# Patient Record
Sex: Male | Born: 1995 | Hispanic: Yes | Marital: Single | Smoking: Current every day smoker
Health system: Southern US, Community
[De-identification: ages and names within clinical notes are randomized; demographics above are authoritative.]

## PROBLEM LIST (undated history)

## (undated) HISTORY — PX: NO PAST SURGERIES: SHX2092

---

## 2020-12-17 ENCOUNTER — Inpatient Hospital Stay (HOSPITAL_COMMUNITY): Payer: No Typology Code available for payment source | Admitting: Anesthesiology

## 2020-12-17 ENCOUNTER — Encounter (HOSPITAL_COMMUNITY): Admission: EM | Disposition: A | Discharge status: Rehabilitation Facility | Payer: Self-pay | Source: Home / Self Care

## 2020-12-17 ENCOUNTER — Emergency Department (HOSPITAL_COMMUNITY): Payer: No Typology Code available for payment source

## 2020-12-17 ENCOUNTER — Inpatient Hospital Stay (HOSPITAL_COMMUNITY): Payer: No Typology Code available for payment source

## 2020-12-17 ENCOUNTER — Inpatient Hospital Stay (HOSPITAL_COMMUNITY)
Admission: EM | Admit: 2020-12-17 | Discharge: 2020-12-25 | DRG: 956 | Disposition: A | Discharge status: Rehabilitation Facility | Payer: No Typology Code available for payment source | Attending: General Surgery | Admitting: General Surgery

## 2020-12-17 ENCOUNTER — Encounter (HOSPITAL_COMMUNITY): Payer: Self-pay

## 2020-12-17 DIAGNOSIS — S72352A Displaced comminuted fracture of shaft of left femur, initial encounter for closed fracture: Secondary | ICD-10-CM | POA: Insufficient documentation

## 2020-12-17 DIAGNOSIS — S0512XA Contusion of eyeball and orbital tissues, left eye, initial encounter: Secondary | ICD-10-CM | POA: Diagnosis present

## 2020-12-17 DIAGNOSIS — Z23 Encounter for immunization: Secondary | ICD-10-CM

## 2020-12-17 DIAGNOSIS — S2222XA Fracture of body of sternum, initial encounter for closed fracture: Secondary | ICD-10-CM | POA: Diagnosis present

## 2020-12-17 DIAGNOSIS — Y907 Blood alcohol level of 200-239 mg/100 ml: Secondary | ICD-10-CM | POA: Diagnosis present

## 2020-12-17 DIAGNOSIS — S72322A Displaced transverse fracture of shaft of left femur, initial encounter for closed fracture: Principal | ICD-10-CM

## 2020-12-17 DIAGNOSIS — S12100A Unspecified displaced fracture of second cervical vertebra, initial encounter for closed fracture: Secondary | ICD-10-CM | POA: Diagnosis present

## 2020-12-17 DIAGNOSIS — H05233 Hemorrhage of bilateral orbit: Secondary | ICD-10-CM

## 2020-12-17 DIAGNOSIS — S2232XA Fracture of one rib, left side, initial encounter for closed fracture: Secondary | ICD-10-CM | POA: Diagnosis present

## 2020-12-17 DIAGNOSIS — Z20822 Contact with and (suspected) exposure to covid-19: Secondary | ICD-10-CM | POA: Diagnosis present

## 2020-12-17 DIAGNOSIS — S0511XA Contusion of eyeball and orbital tissues, right eye, initial encounter: Secondary | ICD-10-CM | POA: Diagnosis present

## 2020-12-17 DIAGNOSIS — F17210 Nicotine dependence, cigarettes, uncomplicated: Secondary | ICD-10-CM | POA: Diagnosis present

## 2020-12-17 DIAGNOSIS — Y92410 Unspecified street and highway as the place of occurrence of the external cause: Secondary | ICD-10-CM

## 2020-12-17 DIAGNOSIS — S83282A Other tear of lateral meniscus, current injury, left knee, initial encounter: Secondary | ICD-10-CM | POA: Diagnosis present

## 2020-12-17 DIAGNOSIS — F1020 Alcohol dependence, uncomplicated: Secondary | ICD-10-CM | POA: Diagnosis present

## 2020-12-17 DIAGNOSIS — T1490XA Injury, unspecified, initial encounter: Secondary | ICD-10-CM

## 2020-12-17 DIAGNOSIS — Z419 Encounter for procedure for purposes other than remedying health state, unspecified: Secondary | ICD-10-CM

## 2020-12-17 DIAGNOSIS — S27322A Contusion of lung, bilateral, initial encounter: Secondary | ICD-10-CM | POA: Diagnosis present

## 2020-12-17 DIAGNOSIS — H532 Diplopia: Secondary | ICD-10-CM | POA: Diagnosis present

## 2020-12-17 DIAGNOSIS — S22089A Unspecified fracture of T11-T12 vertebra, initial encounter for closed fracture: Secondary | ICD-10-CM | POA: Diagnosis present

## 2020-12-17 DIAGNOSIS — S22000A Wedge compression fracture of unspecified thoracic vertebra, initial encounter for closed fracture: Secondary | ICD-10-CM

## 2020-12-17 HISTORY — PX: FEMUR IM NAIL: SHX1597

## 2020-12-17 LAB — COMPREHENSIVE METABOLIC PANEL
ALT: 29 U/L (ref 0–44)
AST: 40 U/L (ref 15–41)
Albumin: 3.8 g/dL (ref 3.5–5.0)
Alkaline Phosphatase: 85 U/L (ref 38–126)
Anion gap: 9 (ref 5–15)
BUN: 12 mg/dL (ref 6–20)
CO2: 22 mmol/L (ref 22–32)
Calcium: 8.2 mg/dL — ABNORMAL LOW (ref 8.9–10.3)
Chloride: 106 mmol/L (ref 98–111)
Creatinine, Ser: 1 mg/dL (ref 0.61–1.24)
GFR, Estimated: >60 mL/min (ref >60)
Glucose, Bld: 115 mg/dL — ABNORMAL HIGH (ref 70–99)
Potassium: 3.5 mmol/L (ref 3.5–5.1)
Sodium: 137 mmol/L (ref 135–145)
Total Bilirubin: 0.4 mg/dL (ref 0.3–1.2)
Total Protein: 6.7 g/dL (ref 6.5–8.1)

## 2020-12-17 LAB — CBC
HCT: 43.1 % (ref 39.0–52.0)
HCT: 39 % (ref 39.0–52.0)
Hemoglobin: 14.6 g/dL (ref 13.0–17.0)
Hemoglobin: 12.9 g/dL — ABNORMAL LOW (ref 13.0–17.0)
MCH: 30 pg (ref 26.0–34.0)
MCH: 29.7 pg (ref 26.0–34.0)
MCHC: 33.9 g/dL (ref 30.0–36.0)
MCHC: 33.1 g/dL (ref 30.0–36.0)
MCV: 88.7 fL (ref 80.0–100.0)
MCV: 89.9 fL (ref 80.0–100.0)
Platelets: 308 10*3/uL (ref 150–400)
Platelets: 281 10*3/uL (ref 150–400)
RBC: 4.86 MIL/uL (ref 4.22–5.81)
RBC: 4.34 MIL/uL (ref 4.22–5.81)
RDW: 12.4 % (ref 11.5–15.5)
RDW: 12.7 % (ref 11.5–15.5)
WBC: 17.8 10*3/uL — ABNORMAL HIGH (ref 4.0–10.5)
WBC: 14.3 10*3/uL — ABNORMAL HIGH (ref 4.0–10.5)
nRBC: 0 % (ref 0.0–0.2)
nRBC: 0 % (ref 0.0–0.2)

## 2020-12-17 LAB — PROTIME-INR
INR: 1.1 (ref 0.8–1.2)
Prothrombin Time: 14.4 seconds (ref 11.4–15.2)

## 2020-12-17 LAB — RESP PANEL BY RT-PCR (FLU A&B, COVID) ARPGX2
Influenza A by PCR: NEGATIVE
Influenza B by PCR: NEGATIVE
SARS Coronavirus 2 by RT PCR: NEGATIVE

## 2020-12-17 LAB — SAMPLE TO BLOOD BANK

## 2020-12-17 LAB — HIV ANTIBODY (ROUTINE TESTING W REFLEX): HIV Screen 4th Generation wRfx: NONREACTIVE

## 2020-12-17 LAB — ETHANOL: Alcohol, Ethyl (B): 234 mg/dL — ABNORMAL HIGH (ref <10)

## 2020-12-17 LAB — CDS SEROLOGY

## 2020-12-17 SURGERY — INSERTION, INTRAMEDULLARY ROD, FEMUR
Anesthesia: General | Site: Leg Upper | Laterality: Left

## 2020-12-17 MED ORDER — CLINDAMYCIN PHOSPHATE 900 MG/50ML IV SOLN
INTRAVENOUS | Status: AC
  Filled 2020-12-17: qty 50

## 2020-12-17 MED ORDER — DOCUSATE SODIUM 100 MG PO CAPS
100.0000 mg | ORAL_CAPSULE | Freq: Two times a day (BID) | ORAL | Status: DC
  Administered 2020-12-17 – 2020-12-25 (×16): 100 mg via ORAL
  Filled 2020-12-17 (×15): qty 1

## 2020-12-17 MED ORDER — SODIUM CHLORIDE 0.9 % IV BOLUS
1000.0000 mL | Freq: Once | INTRAVENOUS | Status: AC
  Administered 2020-12-17: 1000 mL via INTRAVENOUS

## 2020-12-17 MED ORDER — ONDANSETRON HCL 4 MG/2ML IJ SOLN
INTRAMUSCULAR | Status: AC
  Filled 2020-12-17: qty 2

## 2020-12-17 MED ORDER — FENTANYL CITRATE (PF) 100 MCG/2ML IJ SOLN
INTRAMUSCULAR | Status: DC | PRN
  Administered 2020-12-17 (×2): 25 ug via INTRAVENOUS
  Administered 2020-12-17 (×2): 50 ug via INTRAVENOUS

## 2020-12-17 MED ORDER — ONDANSETRON HCL 4 MG/2ML IJ SOLN
INTRAMUSCULAR | Status: DC | PRN
  Administered 2020-12-17: 4 mg via INTRAVENOUS

## 2020-12-17 MED ORDER — SODIUM CHLORIDE 0.9 % IR SOLN
Status: DC | PRN
  Administered 2020-12-17: 1000 mL

## 2020-12-17 MED ORDER — SUCCINYLCHOLINE CHLORIDE 200 MG/10ML IV SOSY
PREFILLED_SYRINGE | INTRAVENOUS | Status: AC
  Filled 2020-12-17: qty 10

## 2020-12-17 MED ORDER — ONDANSETRON 4 MG PO TBDP: 4.0000 mg | ORAL_TABLET | Freq: Four times a day (QID) | ORAL | Status: DC | PRN

## 2020-12-17 MED ORDER — PROPOFOL 10 MG/ML IV BOLUS
INTRAVENOUS | Status: AC
  Filled 2020-12-17: qty 20

## 2020-12-17 MED ORDER — PANTOPRAZOLE SODIUM 40 MG PO TBEC
40.0000 mg | DELAYED_RELEASE_TABLET | Freq: Every day | ORAL | Status: DC
  Administered 2020-12-18 – 2020-12-25 (×8): 40 mg via ORAL
  Filled 2020-12-17 (×8): qty 1

## 2020-12-17 MED ORDER — LIDOCAINE 2% (20 MG/ML) 5 ML SYRINGE
INTRAMUSCULAR | Status: DC | PRN
  Administered 2020-12-17: 60 mg via INTRAVENOUS

## 2020-12-17 MED ORDER — ALBUMIN HUMAN 5 % IV SOLN: INTRAVENOUS | Status: DC | PRN

## 2020-12-17 MED ORDER — MORPHINE SULFATE (PF) 2 MG/ML IV SOLN
2.0000 mg | INTRAVENOUS | Status: DC | PRN
  Administered 2020-12-17 (×2): 4 mg via INTRAVENOUS
  Filled 2020-12-17 (×2): qty 2

## 2020-12-17 MED ORDER — ACETAMINOPHEN 500 MG PO TABS
1000.0000 mg | ORAL_TABLET | Freq: Three times a day (TID) | ORAL | Status: DC
  Administered 2020-12-17 – 2020-12-25 (×23): 1000 mg via ORAL
  Filled 2020-12-17 (×22): qty 2

## 2020-12-17 MED ORDER — ONDANSETRON HCL 4 MG PO TABS: 4.0000 mg | ORAL_TABLET | Freq: Four times a day (QID) | ORAL | Status: DC | PRN

## 2020-12-17 MED ORDER — MIDAZOLAM HCL 2 MG/2ML IJ SOLN
INTRAMUSCULAR | Status: AC
  Filled 2020-12-17: qty 2

## 2020-12-17 MED ORDER — DEXAMETHASONE SODIUM PHOSPHATE 10 MG/ML IJ SOLN
INTRAMUSCULAR | Status: AC
  Filled 2020-12-17: qty 1

## 2020-12-17 MED ORDER — LORAZEPAM 1 MG PO TABS: 1.0000 mg | ORAL_TABLET | ORAL | Status: AC | PRN

## 2020-12-17 MED ORDER — HYDROMORPHONE HCL 1 MG/ML IJ SOLN
0.5000 mg | INTRAMUSCULAR | Status: DC | PRN
  Administered 2020-12-21 – 2020-12-23 (×3): 1 mg via INTRAVENOUS
  Filled 2020-12-17 (×3): qty 1

## 2020-12-17 MED ORDER — LIDOCAINE 2% (20 MG/ML) 5 ML SYRINGE
INTRAMUSCULAR | Status: AC
  Filled 2020-12-17: qty 5

## 2020-12-17 MED ORDER — ACETAMINOPHEN 325 MG PO TABS: 325.0000 mg | ORAL_TABLET | Freq: Four times a day (QID) | ORAL | Status: DC | PRN

## 2020-12-17 MED ORDER — METHOCARBAMOL 1000 MG/10ML IJ SOLN
500.0000 mg | Freq: Four times a day (QID) | INTRAVENOUS | Status: DC | PRN
  Filled 2020-12-17: qty 5

## 2020-12-17 MED ORDER — ENOXAPARIN SODIUM 30 MG/0.3ML IJ SOSY: 30.0000 mg | PREFILLED_SYRINGE | Freq: Two times a day (BID) | INTRAMUSCULAR | Status: DC

## 2020-12-17 MED ORDER — MORPHINE SULFATE (PF) 2 MG/ML IV SOLN: 1.0000 mg | INTRAVENOUS | Status: DC | PRN

## 2020-12-17 MED ORDER — FENTANYL CITRATE (PF) 250 MCG/5ML IJ SOLN
INTRAMUSCULAR | Status: AC
  Filled 2020-12-17: qty 5

## 2020-12-17 MED ORDER — THIAMINE HCL 100 MG PO TABS
100.0000 mg | ORAL_TABLET | Freq: Every day | ORAL | Status: DC
  Administered 2020-12-18 – 2020-12-25 (×8): 100 mg via ORAL
  Filled 2020-12-17 (×8): qty 1

## 2020-12-17 MED ORDER — ADULT MULTIVITAMIN W/MINERALS CH
1.0000 | ORAL_TABLET | Freq: Every day | ORAL | Status: DC
  Administered 2020-12-18 – 2020-12-25 (×8): 1 via ORAL
  Filled 2020-12-17 (×8): qty 1

## 2020-12-17 MED ORDER — CLINDAMYCIN PHOSPHATE 600 MG/50ML IV SOLN
600.0000 mg | Freq: Four times a day (QID) | INTRAVENOUS | Status: AC
  Administered 2020-12-18 (×3): 600 mg via INTRAVENOUS
  Filled 2020-12-17 (×3): qty 50

## 2020-12-17 MED ORDER — OXYCODONE HCL 5 MG PO TABS
10.0000 mg | ORAL_TABLET | ORAL | Status: DC | PRN
  Administered 2020-12-19: 15 mg via ORAL
  Administered 2020-12-20: 10 mg via ORAL
  Filled 2020-12-17: qty 2
  Filled 2020-12-17 (×2): qty 3

## 2020-12-17 MED ORDER — ACETAMINOPHEN 325 MG PO TABS: 650.0000 mg | ORAL_TABLET | ORAL | Status: DC | PRN

## 2020-12-17 MED ORDER — ENOXAPARIN SODIUM 30 MG/0.3ML IJ SOSY
30.0000 mg | PREFILLED_SYRINGE | Freq: Two times a day (BID) | INTRAMUSCULAR | Status: DC
  Administered 2020-12-18 – 2020-12-25 (×15): 30 mg via SUBCUTANEOUS
  Filled 2020-12-17 (×15): qty 0.3

## 2020-12-17 MED ORDER — POTASSIUM CHLORIDE IN NACL 20-0.9 MEQ/L-% IV SOLN
INTRAVENOUS | Status: DC
  Filled 2020-12-17 (×2): qty 1000

## 2020-12-17 MED ORDER — METHOCARBAMOL 500 MG PO TABS
500.0000 mg | ORAL_TABLET | Freq: Four times a day (QID) | ORAL | Status: DC | PRN
  Administered 2020-12-19: 500 mg via ORAL
  Filled 2020-12-17: qty 1

## 2020-12-17 MED ORDER — DOCUSATE SODIUM 100 MG PO CAPS
100.0000 mg | ORAL_CAPSULE | Freq: Two times a day (BID) | ORAL | Status: DC
  Administered 2020-12-17: 100 mg via ORAL
  Filled 2020-12-17: qty 1

## 2020-12-17 MED ORDER — SODIUM CHLORIDE 0.9 % IV SOLN: INTRAVENOUS | Status: DC

## 2020-12-17 MED ORDER — ONDANSETRON HCL 4 MG/2ML IJ SOLN: 4.0000 mg | Freq: Four times a day (QID) | INTRAMUSCULAR | Status: DC | PRN

## 2020-12-17 MED ORDER — METOCLOPRAMIDE HCL 10 MG PO TABS: 5.0000 mg | ORAL_TABLET | Freq: Three times a day (TID) | ORAL | Status: DC | PRN

## 2020-12-17 MED ORDER — IOHEXOL 350 MG/ML SOLN
75.0000 mL | Freq: Once | INTRAVENOUS | Status: AC | PRN
  Administered 2020-12-17: 75 mL via INTRAVENOUS

## 2020-12-17 MED ORDER — ROCURONIUM BROMIDE 10 MG/ML (PF) SYRINGE
PREFILLED_SYRINGE | INTRAVENOUS | Status: AC
  Filled 2020-12-17: qty 10

## 2020-12-17 MED ORDER — THIAMINE HCL 100 MG/ML IJ SOLN
100.0000 mg | Freq: Every day | INTRAMUSCULAR | Status: DC
  Administered 2020-12-17: 100 mg via INTRAVENOUS
  Filled 2020-12-17: qty 2

## 2020-12-17 MED ORDER — PANTOPRAZOLE SODIUM 40 MG IV SOLR
40.0000 mg | Freq: Every day | INTRAVENOUS | Status: DC
  Administered 2020-12-17: 40 mg via INTRAVENOUS
  Filled 2020-12-17: qty 40

## 2020-12-17 MED ORDER — MIDAZOLAM HCL 2 MG/2ML IJ SOLN
INTRAMUSCULAR | Status: DC | PRN
  Administered 2020-12-17: 2 mg via INTRAVENOUS

## 2020-12-17 MED ORDER — DIPHENHYDRAMINE HCL 12.5 MG/5ML PO ELIX
12.5000 mg | ORAL_SOLUTION | ORAL | Status: DC | PRN
Start: 2020-12-17 — End: 2020-12-25

## 2020-12-17 MED ORDER — FOLIC ACID 1 MG PO TABS
1.0000 mg | ORAL_TABLET | Freq: Every day | ORAL | Status: DC
  Administered 2020-12-18 – 2020-12-25 (×8): 1 mg via ORAL
  Filled 2020-12-17 (×8): qty 1

## 2020-12-17 MED ORDER — LACTATED RINGERS IV SOLN: INTRAVENOUS | Status: DC

## 2020-12-17 MED ORDER — HYDROMORPHONE HCL 1 MG/ML IJ SOLN
INTRAMUSCULAR | Status: AC
  Filled 2020-12-17: qty 1

## 2020-12-17 MED ORDER — HYDROMORPHONE HCL 1 MG/ML IJ SOLN: 0.2500 mg | INTRAMUSCULAR | Status: DC | PRN

## 2020-12-17 MED ORDER — TETANUS-DIPHTH-ACELL PERTUSSIS 5-2.5-18.5 LF-MCG/0.5 IM SUSY
0.5000 mL | PREFILLED_SYRINGE | Freq: Once | INTRAMUSCULAR | Status: AC
  Administered 2020-12-17: 0.5 mL via INTRAMUSCULAR
  Filled 2020-12-17: qty 0.5

## 2020-12-17 MED ORDER — OXYCODONE HCL 5 MG PO TABS: 10.0000 mg | ORAL_TABLET | ORAL | Status: DC | PRN

## 2020-12-17 MED ORDER — CHLORHEXIDINE GLUCONATE 0.12 % MT SOLN: 15.0000 mL | Freq: Once | OROMUCOSAL | Status: AC

## 2020-12-17 MED ORDER — MORPHINE SULFATE (PF) 4 MG/ML IV SOLN: 4.0000 mg | Freq: Once | INTRAVENOUS | Status: DC

## 2020-12-17 MED ORDER — ORAL CARE MOUTH RINSE: 15.0000 mL | Freq: Once | OROMUCOSAL | Status: AC

## 2020-12-17 MED ORDER — DEXMEDETOMIDINE (PRECEDEX) IN NS 20 MCG/5ML (4 MCG/ML) IV SYRINGE
PREFILLED_SYRINGE | INTRAVENOUS | Status: DC | PRN
  Administered 2020-12-17: 8 ug via INTRAVENOUS
  Administered 2020-12-17: 12 ug via INTRAVENOUS
  Administered 2020-12-17: 4 ug via INTRAVENOUS
  Administered 2020-12-17: 8 ug via INTRAVENOUS

## 2020-12-17 MED ORDER — OXYCODONE HCL 5 MG PO TABS
5.0000 mg | ORAL_TABLET | ORAL | Status: DC | PRN
  Administered 2020-12-17: 10 mg via ORAL
  Administered 2020-12-19: 5 mg via ORAL
  Filled 2020-12-17: qty 1

## 2020-12-17 MED ORDER — ONDANSETRON HCL 4 MG/2ML IJ SOLN
4.0000 mg | Freq: Four times a day (QID) | INTRAMUSCULAR | Status: DC | PRN
Start: 2020-12-17 — End: 2020-12-25

## 2020-12-17 MED ORDER — CLINDAMYCIN PHOSPHATE 900 MG/50ML IV SOLN
INTRAVENOUS | Status: DC | PRN
  Administered 2020-12-17: 900 mg via INTRAVENOUS

## 2020-12-17 MED ORDER — OXYCODONE HCL 5 MG PO TABS: 5.0000 mg | ORAL_TABLET | ORAL | Status: DC | PRN

## 2020-12-17 MED ORDER — ROCURONIUM BROMIDE 10 MG/ML (PF) SYRINGE
PREFILLED_SYRINGE | INTRAVENOUS | Status: DC | PRN
  Administered 2020-12-17: 50 mg via INTRAVENOUS

## 2020-12-17 MED ORDER — SUCCINYLCHOLINE CHLORIDE 20 MG/ML IJ SOLN
INTRAMUSCULAR | Status: DC | PRN
  Administered 2020-12-17: 100 mg via INTRAVENOUS

## 2020-12-17 MED ORDER — OXYCODONE HCL 5 MG PO TABS
5.0000 mg | ORAL_TABLET | ORAL | Status: DC | PRN
  Filled 2020-12-17: qty 2

## 2020-12-17 MED ORDER — DEXAMETHASONE SODIUM PHOSPHATE 10 MG/ML IJ SOLN
INTRAMUSCULAR | Status: DC | PRN
  Administered 2020-12-17: 10 mg via INTRAVENOUS

## 2020-12-17 MED ORDER — PROPOFOL 10 MG/ML IV BOLUS
INTRAVENOUS | Status: DC | PRN
  Administered 2020-12-17: 130 mg via INTRAVENOUS

## 2020-12-17 MED ORDER — CHLORHEXIDINE GLUCONATE 0.12 % MT SOLN
OROMUCOSAL | Status: AC
  Administered 2020-12-17: 15 mL via OROMUCOSAL
  Filled 2020-12-17: qty 15

## 2020-12-17 MED ORDER — SUGAMMADEX SODIUM 200 MG/2ML IV SOLN
INTRAVENOUS | Status: DC | PRN
  Administered 2020-12-17: 300 mg via INTRAVENOUS

## 2020-12-17 MED ORDER — METOCLOPRAMIDE HCL 5 MG/ML IJ SOLN
5.0000 mg | Freq: Three times a day (TID) | INTRAMUSCULAR | Status: DC | PRN
Start: 2020-12-17 — End: 2020-12-20

## 2020-12-17 MED ORDER — LORAZEPAM 2 MG/ML IJ SOLN
1.0000 mg | INTRAMUSCULAR | Status: AC | PRN
Start: 2020-12-17 — End: 2020-12-20

## 2020-12-17 SURGICAL SUPPLY — 49 items
BIT DRILL LONG 4.0 (BIT) ×1 IMPLANT
BIT DRILL SHORT 4.0 (BIT) ×1 IMPLANT
BNDG COHESIVE 6X5 TAN STRL LF (GAUZE/BANDAGES/DRESSINGS) ×6 IMPLANT
COVER PERINEAL POST (MISCELLANEOUS) ×2 IMPLANT
COVER SURGICAL LIGHT HANDLE (MISCELLANEOUS) ×2 IMPLANT
COVER WAND RF STERILE (DRAPES) ×2 IMPLANT
DRAPE C-ARM 42X72 X-RAY (DRAPES) ×2 IMPLANT
DRAPE C-ARMOR (DRAPES) ×4 IMPLANT
DRAPE STERI IOBAN 125X83 (DRAPES) ×2 IMPLANT
DRESSING MEPILEX FLEX 4X4 (GAUZE/BANDAGES/DRESSINGS) ×1 IMPLANT
DRILL BIT LONG 4.0 (BIT) ×2
DRILL BIT SHORT 4.0 (BIT) ×2
DRSG MEPILEX BORDER 4X8 (GAUZE/BANDAGES/DRESSINGS) ×2 IMPLANT
DRSG MEPILEX FLEX 4X4 (GAUZE/BANDAGES/DRESSINGS) ×2
DURAPREP 26ML APPLICATOR (WOUND CARE) ×2 IMPLANT
ELECT REM PT RETURN 9FT ADLT (ELECTROSURGICAL) ×2
ELECTRODE REM PT RTRN 9FT ADLT (ELECTROSURGICAL) ×1 IMPLANT
FACESHIELD WRAPAROUND (MASK) ×2 IMPLANT
GAUZE XEROFORM 5X9 LF (GAUZE/BANDAGES/DRESSINGS) ×2 IMPLANT
GLOVE BIO SURGEON STRL SZ8 (GLOVE) ×2 IMPLANT
GLOVE ORTHO TXT STRL SZ7.5 (GLOVE) ×2 IMPLANT
GLOVE SRG 8 PF TXTR STRL LF DI (GLOVE) ×1 IMPLANT
GLOVE SURG UNDER POLY LF SZ8 (GLOVE) ×2
GOWN STRL REUS W/ TWL LRG LVL3 (GOWN DISPOSABLE) ×1 IMPLANT
GOWN STRL REUS W/ TWL XL LVL3 (GOWN DISPOSABLE) ×3 IMPLANT
GOWN STRL REUS W/TWL LRG LVL3 (GOWN DISPOSABLE) ×2
GOWN STRL REUS W/TWL XL LVL3 (GOWN DISPOSABLE) ×6
GUIDE PIN 3.2X343 (PIN) ×1
GUIDE PIN 3.2X343MM (PIN) ×2
GUIDE ROD 3.0 (MISCELLANEOUS) ×2
KIT BASIN OR (CUSTOM PROCEDURE TRAY) ×2 IMPLANT
KIT TURNOVER KIT B (KITS) ×2 IMPLANT
MANIFOLD NEPTUNE II (INSTRUMENTS) ×2 IMPLANT
NAIL TROCH META-TAN 9X36 LT (Nail) ×2 IMPLANT
NS IRRIG 1000ML POUR BTL (IV SOLUTION) ×2 IMPLANT
PACK GENERAL/GYN (CUSTOM PROCEDURE TRAY) ×2 IMPLANT
PAD ARMBOARD 7.5X6 YLW CONV (MISCELLANEOUS) ×4 IMPLANT
PIN GUIDE 3.2X343MM (PIN) ×1 IMPLANT
ROD GUIDE 3.0 (MISCELLANEOUS) ×1 IMPLANT
SCREW TRIGEN LOW PROF 5.0X40 (Screw) ×2 IMPLANT
SCREW TRIGEN LOW PROF 5.0X70 (Screw) ×2 IMPLANT
SUT VIC AB 0 CT1 27 (SUTURE) ×4
SUT VIC AB 0 CT1 27XBRD ANBCTR (SUTURE) ×2 IMPLANT
SUT VIC AB 1 CT1 27 (SUTURE) ×2
SUT VIC AB 1 CT1 27XBRD ANBCTR (SUTURE) ×1 IMPLANT
SUT VIC AB 2-0 CT1 27 (SUTURE) ×4
SUT VIC AB 2-0 CT1 TAPERPNT 27 (SUTURE) ×2 IMPLANT
TOWEL GREEN STERILE (TOWEL DISPOSABLE) ×2 IMPLANT
TOWEL GREEN STERILE FF (TOWEL DISPOSABLE) ×2 IMPLANT

## 2020-12-17 NOTE — Brief Op Note (Signed)
12/17/2020  7:59 PM  PATIENT:  Bob Hawkins  25 y.o. male  PRE-OPERATIVE DIAGNOSIS:  left femur shaft fracture  POST-OPERATIVE DIAGNOSIS:  left femur shaft fracture  PROCEDURE:  Procedure(s): INTRAMEDULLARY (IM) NAIL FEMORAL (Left)  SURGEON:  Surgeon(s) and Role:    Kathryne Hitch, MD - Primary  PHYSICIAN ASSISTANT:  Rexene Edison, PA-C  ANESTHESIA:   general  COUNTS:  YES  TOURNIQUET:  * No tourniquets in log *  DICTATION: .Other Dictation: Dictation Number [id number]  PLAN OF CARE: Admit to inpatient   PATIENT DISPOSITION:  PACU - hemodynamically stable.   Delay start of Pharmacological VTE agent (>24hrs) due to surgical blood loss or risk of bleeding: no

## 2020-12-17 NOTE — ED Notes (Signed)
Attempted report x1. 

## 2020-12-17 NOTE — Progress Notes (Signed)
Orthopedic Tech Progress Note Patient Details:  Bob Hawkins 21-Nov-1995 [id number]  Patient ID: Bob Hawkins, male   DOB: [birth year], 25 y.o.   MRN: [id number]   Bob Hawkins 12/17/2020, 3:30 AM Left knee immobilizer applied

## 2020-12-17 NOTE — Transfer of Care (Signed)
Immediate Anesthesia Transfer of Care Note  Patient: Bob Hawkins  Procedure(s) Performed: INTRAMEDULLARY (IM) NAIL FEMORAL (Left Leg Upper)  Patient Location: PACU  Anesthesia Type:General  Level of Consciousness: drowsy  Airway & Oxygen Therapy: Patient Spontanous Breathing and Patient connected to face mask oxygen  Post-op Assessment: Report given to RN, Post -op Vital signs reviewed and stable and Patient moving all extremities  Post vital signs: Reviewed and stable  Last Vitals:  Vitals Value Taken Time  BP 121/57 12/17/20 2028  Temp    Pulse 83 12/17/20 2038  Resp 16 12/17/20 2038  SpO2 92 % 12/17/20 2038  Vitals shown include unvalidated device data.  Last Pain:  Vitals:   12/17/20 1611  TempSrc: Oral  PainSc: 8       Patients Stated Pain Goal: 3 (12/17/20 1611)  Complications: No complications documented.
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
## 2020-12-17 NOTE — Progress Notes (Signed)
Patient arrived on the unit from PACU. Patient alert and oriented x4, moves all extremities very well. Vital sign stable. No s/s of distress with this assessment.

## 2020-12-17 NOTE — Progress Notes (Signed)
CSW was able to speak with patient to locate a family member who can come to the hospital. Patient stated his sister Bob Hawkins works at [employer] in [place] on [address]. CSW spoke with sister and provided her with the address and hospital name. Patient also was able to speak with his sister via speaker phone. Patient agreed to add his sister to his hospital list. Patients sisters number is [phone].
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
## 2020-12-17 NOTE — TOC CAGE-AID Note (Signed)
Transition of Care [facility] - CAGE-AID Screening   Patient Details  Name: Graves Nipp Jesus Robb Sibal MRN: [id number] Date of Birth: 1996-03-07   Hewitt Shorts, RN  Trauma Response Nurse Phone Number: [phone] 12/17/2020, 9:46 AM    CAGE-AID Screening:    Have You Ever Felt You Ought to Cut Down on Your Drinking or Drug Use?: No Have People Annoyed You By Critizing Your Drinking Or Drug Use?: No Have You Felt Bad Or Guilty About Your Drinking Or Drug Use?: No Have You Ever Had a Drink or Used Drugs First Thing In The Morning to Steady Your Nerves or to Get Rid of a Hangover?: No CAGE-AID Score: 0  Substance Abuse Education Offered: Yes (Pt declined substance abuse education. states he drinks occasionally, not everyday, and sociallly uses cocaine.)
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
## 2020-12-17 NOTE — ED Notes (Signed)
Pt resting comfortably in bed, states that his pain is tolerable at this time Family remains at bedside.

## 2020-12-17 NOTE — ED Notes (Signed)
Aspen collar placed on pt.
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
## 2020-12-17 NOTE — Progress Notes (Signed)
Orthopedic Tech Progress Note Patient Details:  Bob Hawkins [birth year] [id number]  Musculoskeletal Traction Type of Traction: Bucks Skin Traction Traction Location: LLE Traction Weight: 10 lbs   Post Interventions Patient Tolerated: Well Instructions Provided: Adjustment of device   Maurene Capes 12/17/2020, 9:49 AM

## 2020-12-17 NOTE — Progress Notes (Signed)
Left message with [facility] answering service for outside vendor brace.  Patient will need "clamshell spine brace" medium.  Brace info, [address] progressive phone number, and patient height and weight left with answering service.
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
## 2020-12-17 NOTE — ED Notes (Signed)
Placed pt on 2L 02 per [place]. 
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
## 2020-12-17 NOTE — Anesthesia Procedure Notes (Signed)
Procedure Name: Intubation Date/Time: 12/17/2020 6:43 PM Performed by: Dairl Ponder, CRNA Pre-anesthesia Checklist: Patient identified, Emergency Drugs available, Suction available, Patient being monitored and Timeout performed Patient Re-evaluated:Patient Re-evaluated prior to induction Oxygen Delivery Method: Circle system utilized Preoxygenation: Pre-oxygenation with 100% oxygen Induction Type: IV induction, Rapid sequence and Cricoid Pressure applied Laryngoscope Size: Glidescope and 3 (C pine fx) Grade View: Grade I Tube type: Oral Tube size: 7.5 mm Number of attempts: 1 Airway Equipment and Method: Stylet Placement Confirmation: ETT inserted through vocal cords under direct vision,  positive ETCO2 and breath sounds checked- equal and bilateral Secured at: 23 cm Tube secured with: Tape Dental Injury: Teeth and Oropharynx as per pre-operative assessment

## 2020-12-18 LAB — COMPREHENSIVE METABOLIC PANEL
ALT: 36 U/L (ref 0–44)
AST: 66 U/L — ABNORMAL HIGH (ref 15–41)
Albumin: 3.3 g/dL — ABNORMAL LOW (ref 3.5–5.0)
Alkaline Phosphatase: 62 U/L (ref 38–126)
Anion gap: 7 (ref 5–15)
BUN: 11 mg/dL (ref 6–20)
CO2: 26 mmol/L (ref 22–32)
Calcium: 8.3 mg/dL — ABNORMAL LOW (ref 8.9–10.3)
Chloride: 104 mmol/L (ref 98–111)
Creatinine, Ser: 0.94 mg/dL (ref 0.61–1.24)
GFR, Estimated: >60 mL/min (ref >60)
Glucose, Bld: 140 mg/dL — ABNORMAL HIGH (ref 70–99)
Potassium: 4.4 mmol/L (ref 3.5–5.1)
Sodium: 137 mmol/L (ref 135–145)
Total Bilirubin: 1 mg/dL (ref 0.3–1.2)
Total Protein: 6.2 g/dL — ABNORMAL LOW (ref 6.5–8.1)

## 2020-12-18 LAB — CBC
HCT: 36.7 % — ABNORMAL LOW (ref 39.0–52.0)
Hemoglobin: 12.2 g/dL — ABNORMAL LOW (ref 13.0–17.0)
MCH: 29.8 pg (ref 26.0–34.0)
MCHC: 33.2 g/dL (ref 30.0–36.0)
MCV: 89.5 fL (ref 80.0–100.0)
Platelets: 222 10*3/uL (ref 150–400)
RBC: 4.1 MIL/uL — ABNORMAL LOW (ref 4.22–5.81)
RDW: 12.9 % (ref 11.5–15.5)
WBC: 11.5 10*3/uL — ABNORMAL HIGH (ref 4.0–10.5)
nRBC: 0 % (ref 0.0–0.2)

## 2020-12-18 MED ORDER — BACITRACIN ZINC 500 UNIT/GM EX OINT
TOPICAL_OINTMENT | Freq: Two times a day (BID) | CUTANEOUS | Status: DC
  Administered 2020-12-18 – 2020-12-21 (×4): 1 via TOPICAL
  Filled 2020-12-18 (×2): qty 28.4
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
## 2020-12-18 NOTE — Anesthesia Postprocedure Evaluation (Signed)
Anesthesia Post Note  Patient: Bob Hawkins  Procedure(s) Performed: INTRAMEDULLARY (IM) NAIL FEMORAL (Left Leg Upper)     Patient location during evaluation: PACU Anesthesia Type: General Level of consciousness: awake and alert Pain management: pain level controlled Vital Signs Assessment: post-procedure vital signs reviewed and stable Respiratory status: spontaneous breathing, nonlabored ventilation, respiratory function stable and patient connected to nasal cannula oxygen Cardiovascular status: blood pressure returned to baseline and stable Postop Assessment: no apparent nausea or vomiting Anesthetic complications: no   No complications documented.  Last Vitals:  Vitals:   12/17/20 2217 12/17/20 2332  BP: 130/70 (!) 119/57  Pulse:  73  Resp:  12  Temp: 37 C 36.8 C  SpO2:  92%    Last Pain:  Vitals:   12/17/20 2332  TempSrc: Oral  PainSc:                  Belen Pesch,W. EDMOND

## 2020-12-18 NOTE — Progress Notes (Signed)
Patient ID: Bob Hawkins, male   DOB: 11/06/1995, 25 y.o.   MRN: [id number] The patient is awake and alert at the bedside.  He tolerated surgery late yesterday to stabilize his left femur fracture with an intramedullary nail.  I was able to describe the surgery to him in detail.  X-rays were also shared of his left femur pre and postop.  From an orthopedic standpoint, he can be up with therapy with weightbearing as tolerated on the left femur.  I did explain to him what that means and if he is not tolerating weightbearing to back off.  He says he is much more comfortable postop than he was preop with right femur fracture.  On exam, the dressings are clean dry on the left lower extremity.  His foot is well-perfused on that side with normal sensation and he is able to flex and extend the ankle and toes on the left side.

## 2020-12-18 NOTE — Progress Notes (Signed)
PT Cancellation Note  Patient Details Name: Bob Hawkins [name] MRN: [id number] DOB: 07/12/1996   Cancelled Treatment:    Reason Eval/Treat Not Completed: Patient not medically ready;Other (comment). Active orders for pt to keep flat and log roll. No brace present and will not arrive until Monday, limiting pt's ability to get OOB and participate in PT, therefore will hold off on PT eval until then. Coordinated with RN in regards to pt performing bed level exercises and ROM as appropriate until then. Will follow-up another day as appropriate.   Raymond Gurney, PT, DPT Acute Rehabilitation Services  Pager: [phone] Office: [phone]    Jewel Baize 12/18/2020, 9:05 AM

## 2020-12-18 NOTE — Progress Notes (Signed)
Orthopedic Tech Progress Note Patient Details:  Bob Hawkins [birth year] [id number] RN stated "BACK BRACE has been ordered and should arrive Monday" Patient ID: Bob Hawkins, male   DOB: 02-04-1996, 25 y.o.   MRN: [id number]   [name] 12/18/2020, 7:50 AM

## 2020-12-18 NOTE — Progress Notes (Signed)
OT Cancellation Note  Patient Details Name: Bob Hawkins MRN: [id number] DOB: 03/24/1996   Cancelled Treatment:    Reason Eval/Treat Not Completed: Patient not medically ready (Active orders for pt to keep flat and log roll. No brace present and will not arrive until Monday, limiting pt's ability to get OOB and participate in Hold OT eval until brace is delivered. Staff aware of positioning and ROM needs.)  OT to continue to follow.  Flora Lipps, OTR/L Acute Rehabilitation Services Pager: [phone] Office: [phone]   Cynai Skeens C 12/18/2020, 2:00 PM

## 2020-12-19 LAB — BASIC METABOLIC PANEL
Anion gap: 9 (ref 5–15)
BUN: 14 mg/dL (ref 6–20)
CO2: 27 mmol/L (ref 22–32)
Calcium: 8.4 mg/dL — ABNORMAL LOW (ref 8.9–10.3)
Chloride: 101 mmol/L (ref 98–111)
Creatinine, Ser: 0.79 mg/dL (ref 0.61–1.24)
GFR, Estimated: >60 mL/min (ref >60)
Glucose, Bld: 126 mg/dL — ABNORMAL HIGH (ref 70–99)
Potassium: 4 mmol/L (ref 3.5–5.1)
Sodium: 137 mmol/L (ref 135–145)

## 2020-12-19 LAB — CBC
HCT: 32.2 % — ABNORMAL LOW (ref 39.0–52.0)
Hemoglobin: 11 g/dL — ABNORMAL LOW (ref 13.0–17.0)
MCH: 30 pg (ref 26.0–34.0)
MCHC: 34.2 g/dL (ref 30.0–36.0)
MCV: 87.7 fL (ref 80.0–100.0)
Platelets: 195 10*3/uL (ref 150–400)
RBC: 3.67 MIL/uL — ABNORMAL LOW (ref 4.22–5.81)
RDW: 12.5 % (ref 11.5–15.5)
WBC: 11.4 10*3/uL — ABNORMAL HIGH (ref 4.0–10.5)
nRBC: 0 % (ref 0.0–0.2)

## 2020-12-19 NOTE — Progress Notes (Signed)
Received call from [facility] from [facility] stating that back brace for patient will not come in until Tuesday morning because they are short staffed and the product is coming from a different state.   Mikki Harbor, RN
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
## 2020-12-20 ENCOUNTER — Encounter (HOSPITAL_COMMUNITY): Payer: Self-pay | Admitting: Orthopaedic Surgery

## 2020-12-20 ENCOUNTER — Inpatient Hospital Stay (HOSPITAL_COMMUNITY): Payer: No Typology Code available for payment source

## 2020-12-20 LAB — CBC
HCT: 33.4 % — ABNORMAL LOW (ref 39.0–52.0)
Hemoglobin: 11.2 g/dL — ABNORMAL LOW (ref 13.0–17.0)
MCH: 29.8 pg (ref 26.0–34.0)
MCHC: 33.5 g/dL (ref 30.0–36.0)
MCV: 88.8 fL (ref 80.0–100.0)
Platelets: 217 10*3/uL (ref 150–400)
RBC: 3.76 MIL/uL — ABNORMAL LOW (ref 4.22–5.81)
RDW: 12.4 % (ref 11.5–15.5)
WBC: 8.5 10*3/uL (ref 4.0–10.5)
nRBC: 0 % (ref 0.0–0.2)

## 2020-12-20 MED ORDER — LORAZEPAM 2 MG/ML IJ SOLN
1.0000 mg | Freq: Once | INTRAMUSCULAR | Status: AC | PRN
  Administered 2020-12-20: 1 mg via INTRAVENOUS
  Filled 2020-12-20: qty 1

## 2020-12-20 MED ORDER — OXYCODONE HCL 5 MG PO TABS
5.0000 mg | ORAL_TABLET | ORAL | Status: DC | PRN
  Administered 2020-12-21 – 2020-12-24 (×6): 10 mg via ORAL
  Administered 2020-12-25: 5 mg via ORAL
  Filled 2020-12-20: qty 2
  Filled 2020-12-20: qty 1
  Filled 2020-12-20 (×5): qty 2

## 2020-12-20 MED ORDER — BISACODYL 10 MG RE SUPP: 10.0000 mg | Freq: Every day | RECTAL | Status: DC | PRN

## 2020-12-20 MED ORDER — POLYETHYLENE GLYCOL 3350 17 G PO PACK
17.0000 g | PACK | Freq: Every day | ORAL | Status: DC
  Administered 2020-12-20 – 2020-12-21 (×2): 17 g via ORAL
  Filled 2020-12-20 (×2): qty 1

## 2020-12-20 MED ORDER — METHOCARBAMOL 500 MG PO TABS
500.0000 mg | ORAL_TABLET | Freq: Three times a day (TID) | ORAL | Status: DC
  Administered 2020-12-20 – 2020-12-22 (×6): 500 mg via ORAL
  Filled 2020-12-20 (×6): qty 1

## 2020-12-20 NOTE — Progress Notes (Signed)
PT Cancellation Note  Patient Details Name: Bob Hawkins MRN: [id number] DOB: 1996/02/14   Cancelled Treatment:    Reason Eval/Treat Not Completed: Other (comment) Clamshell TLSO does not arrive until tomorrow.  Lillia Pauls, PT, DPT Acute Rehabilitation Services Pager [phone] Office [phone]    Norval Morton 12/20/2020, 8:48 AM
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
## 2020-12-20 NOTE — Progress Notes (Signed)
OT Cancellation Note  Patient Details Name: Bob Hawkins MRN: [id number] DOB: Mar 12, 1996   Cancelled Treatment:    Reason Eval/Treat Not Completed: Patient not medically ready (waiting for TLSO to mobilize. It is supposed to arrive tomorrow, 5/24. OT to follow for OT intervention.)   Flora Lipps, OTR/L Acute Rehabilitation Services Pager: [phone] Office: [phone] Louise Rawson C 12/20/2020, 4:20 PM

## 2020-12-20 NOTE — Progress Notes (Signed)
Patient ID: Bob Hawkins, male   DOB: 29-Mar-1996, 25 y.o.   MRN: [id number] I did review the patient's MRI of his left knee.  There is a small avulsion fracture that is nondisplaced and incomplete of the medial tibial eminence.  There are some edema in this area.  There is a small lateral meniscal tear as well.  This can be treated conservatively for now.  He does not need to work on aggressive knee range of motion or strengthening.  He can be up with therapy for his left lower extremity with attempted weightbearing as tolerated and will likely mobilize using crutches for now or even a walker.  This does not need a knee brace from my standpoint or a knee immobilizer.

## 2020-12-21 MED ORDER — POLYETHYLENE GLYCOL 3350 17 G PO PACK
17.0000 g | PACK | Freq: Two times a day (BID) | ORAL | Status: DC
  Administered 2020-12-21 – 2020-12-25 (×7): 17 g via ORAL
  Filled 2020-12-21 (×8): qty 1
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
## 2020-12-21 NOTE — Progress Notes (Signed)
Patient ID: Bob Hawkins, male   DOB: 1996-06-01, 25 y.o.   MRN: [id number] I did just see the patient at the bedside.  His incisions look good and his left thigh.  His left knee does have a moderate effusion.  I talked him about the MRI findings.  Fortunately this does not need any urgent surgical intervention at all and may not end up needing surgery once things heal with the knee.  He is more tender with his knee on the left side of the knee as well as the femur fracture.  When he is allowed up with therapy, he can still attempt some weightbearing as tolerated on that left lower extremity but should go slow and he does not need to work on a knee strengthening exercises as of yet because I do not think he will tolerate this and he needs to just gently bend the knee when he can tolerate it.  The knee is something that I will follow-up at the same time when I see him in the office for his left femur in 2 weeks.  There is not really a knee brace that I recommend right now for the ones that are provided in the hospital.  He does not need a knee immobilizer.

## 2020-12-21 NOTE — Progress Notes (Signed)
PT Cancellation Note  Patient Details Name: Walfred Bettendorf Jesus Delfino Friesen MRN: [id number] DOB: 12-11-1995   Cancelled Treatment:    Reason Eval/Treat Not Completed: Other (comment) Still awaiting TLSO clamshell brace.  Lillia Pauls, PT, DPT Acute Rehabilitation Services Pager [phone] Office [phone]    Norval Morton 12/21/2020, 10:51 AM
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
## 2020-12-21 NOTE — Progress Notes (Signed)
Inpatient Rehab Admissions Coordinator Note:   Per therapy recommendations, pt was screened for CIR candidacy by Ozell Juhasz, MS CCC-SLP. At this time, Pt. Appears to have functional decline and is a good candidate for CIR. Will place order for rehab consult per protocol.  Please contact me with questions.   Lucky Trotta, MS, CCC-SLP Rehab Admissions Coordinator  [phone] (celll) [phone] (office)  

## 2020-12-22 MED ORDER — BISACODYL 10 MG RE SUPP: 10.0000 mg | Freq: Once | RECTAL | Status: DC

## 2020-12-22 MED ORDER — METHOCARBAMOL 500 MG PO TABS
1000.0000 mg | ORAL_TABLET | Freq: Three times a day (TID) | ORAL | Status: DC
  Administered 2020-12-22 – 2020-12-25 (×9): 1000 mg via ORAL
  Filled 2020-12-22 (×9): qty 2
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
## 2020-12-22 NOTE — Progress Notes (Signed)
Orthostatics  Lying:  115/60 Sitting:  117/82 Standing:  108/88
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
## 2020-12-22 NOTE — Progress Notes (Signed)
Inpatient Rehab Admissions Coordinator:   Met with patient and his significant other, Caren Griffins, at the bedside today to discuss CIR goals/expectations.  We discussed average length of stay on CIR to be about 2 weeks, but heavily dependent on progress.  We also discussed typical recommendations for 24/7 supervision at discharge.  Caren Griffins confirms that between her and pt's sister, Dedra Skeens, he will have adequate help at discharge.  I also reviewed cost of rehab with Joel and Caren Griffins and explained process for Medicaid screening and payment plans once bill is received.  Both are in agreement to proceed with admission once we have a bed available.    Shann Medal, PT, DPT Admissions Coordinator [phone] 12/22/20  11:10 AM

## 2020-12-22 NOTE — Progress Notes (Signed)
Patient who is A&Ox4, has not had a bowel movement PTA, refused rectal suppository for this shi and previous shift. Patient stated "I will wait for when I work for PT and see if I will go once I start walking tomorrow". Would be interested in rectal suppository if no BM tomorrow.
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
## 2020-12-23 MED ORDER — MAGNESIUM CITRATE PO SOLN
0.5000 | Freq: Once | ORAL | Status: DC
  Filled 2020-12-23: qty 296

## 2020-12-23 NOTE — Progress Notes (Signed)
Nurse withheld giving patient mag citrate due to patient having a successful BM using the BSC.
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
## 2020-12-23 NOTE — Progress Notes (Signed)
Inpatient Rehab Admissions Coordinator:   I have no beds available for this patient to admit to CIR today.  Will continue to follow for timing of potential admission pending bed availability.   Estill Dooms, PT, DPT Admissions Coordinator [phone] 12/23/20  9:51 AM

## 2020-12-24 LAB — CBC
HCT: 32.3 % — ABNORMAL LOW (ref 39.0–52.0)
Hemoglobin: 11.1 g/dL — ABNORMAL LOW (ref 13.0–17.0)
MCH: 30.1 pg (ref 26.0–34.0)
MCHC: 34.4 g/dL (ref 30.0–36.0)
MCV: 87.5 fL (ref 80.0–100.0)
Platelets: 348 10*3/uL (ref 150–400)
RBC: 3.69 MIL/uL — ABNORMAL LOW (ref 4.22–5.81)
RDW: 12.5 % (ref 11.5–15.5)
WBC: 9.4 10*3/uL (ref 4.0–10.5)
nRBC: 0 % (ref 0.0–0.2)

## 2020-12-24 LAB — BASIC METABOLIC PANEL
Anion gap: 9 (ref 5–15)
BUN: 15 mg/dL (ref 6–20)
CO2: 27 mmol/L (ref 22–32)
Calcium: 8.7 mg/dL — ABNORMAL LOW (ref 8.9–10.3)
Chloride: 99 mmol/L (ref 98–111)
Creatinine, Ser: 0.76 mg/dL (ref 0.61–1.24)
GFR, Estimated: >60 mL/min (ref >60)
Glucose, Bld: 89 mg/dL (ref 70–99)
Potassium: 4 mmol/L (ref 3.5–5.1)
Sodium: 135 mmol/L (ref 135–145)

## 2020-12-24 MED ORDER — SODIUM CHLORIDE 0.9 % IV SOLN: INTRAVENOUS | Status: DC
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
## 2020-12-24 NOTE — Progress Notes (Signed)
Inpatient Rehab Admissions Coordinator:   I have no beds available for this patient to admit to CIR today.  Will continue to follow for timing of potential admission pending bed availability. I updated pt and his significant other at the bedside.   Estill Dooms, PT, DPT Admissions Coordinator [phone] 12/24/20  10:59 AM

## 2020-12-25 ENCOUNTER — Inpatient Hospital Stay (HOSPITAL_COMMUNITY)
Admission: EM | Admit: 2020-12-25 | Discharge: 2021-01-01 | DRG: 560 | Disposition: A | Discharge status: Home / Self Care | Payer: Self-pay | Source: Intra-hospital | Attending: Physical Medicine & Rehabilitation | Admitting: Physical Medicine & Rehabilitation

## 2020-12-25 ENCOUNTER — Other Ambulatory Visit: Payer: Self-pay

## 2020-12-25 ENCOUNTER — Encounter (HOSPITAL_COMMUNITY): Payer: Self-pay | Admitting: Physical Medicine & Rehabilitation

## 2020-12-25 DIAGNOSIS — G8918 Other acute postprocedural pain: Secondary | ICD-10-CM

## 2020-12-25 DIAGNOSIS — S27322D Contusion of lung, bilateral, subsequent encounter: Secondary | ICD-10-CM | POA: Diagnosis not present

## 2020-12-25 DIAGNOSIS — F17210 Nicotine dependence, cigarettes, uncomplicated: Secondary | ICD-10-CM | POA: Diagnosis present

## 2020-12-25 DIAGNOSIS — S83282D Other tear of lateral meniscus, current injury, left knee, subsequent encounter: Secondary | ICD-10-CM | POA: Diagnosis not present

## 2020-12-25 DIAGNOSIS — I82452 Acute embolism and thrombosis of left peroneal vein: Secondary | ICD-10-CM | POA: Diagnosis present

## 2020-12-25 DIAGNOSIS — D62 Acute posthemorrhagic anemia: Secondary | ICD-10-CM | POA: Diagnosis present

## 2020-12-25 DIAGNOSIS — S0512XD Contusion of eyeball and orbital tissues, left eye, subsequent encounter: Secondary | ICD-10-CM | POA: Diagnosis not present

## 2020-12-25 DIAGNOSIS — S2232XD Fracture of one rib, left side, subsequent encounter for fracture with routine healing: Secondary | ICD-10-CM

## 2020-12-25 DIAGNOSIS — S22058D Other fracture of T5-T6 vertebra, subsequent encounter for fracture with routine healing: Secondary | ICD-10-CM | POA: Diagnosis not present

## 2020-12-25 DIAGNOSIS — S12190D Other displaced fracture of second cervical vertebra, subsequent encounter for fracture with routine healing: Secondary | ICD-10-CM

## 2020-12-25 DIAGNOSIS — S72302S Unspecified fracture of shaft of left femur, sequela: Secondary | ICD-10-CM

## 2020-12-25 DIAGNOSIS — E8809 Other disorders of plasma-protein metabolism, not elsewhere classified: Secondary | ICD-10-CM | POA: Diagnosis present

## 2020-12-25 DIAGNOSIS — S12100S Unspecified displaced fracture of second cervical vertebra, sequela: Secondary | ICD-10-CM

## 2020-12-25 DIAGNOSIS — S22068D Other fracture of T7-T8 thoracic vertebra, subsequent encounter for fracture with routine healing: Secondary | ICD-10-CM

## 2020-12-25 DIAGNOSIS — E46 Unspecified protein-calorie malnutrition: Secondary | ICD-10-CM | POA: Diagnosis present

## 2020-12-25 DIAGNOSIS — S2220XD Unspecified fracture of sternum, subsequent encounter for fracture with routine healing: Secondary | ICD-10-CM

## 2020-12-25 DIAGNOSIS — S0511XD Contusion of eyeball and orbital tissues, right eye, subsequent encounter: Secondary | ICD-10-CM

## 2020-12-25 DIAGNOSIS — Z880 Allergy status to penicillin: Secondary | ICD-10-CM | POA: Diagnosis not present

## 2020-12-25 DIAGNOSIS — S32018D Other fracture of first lumbar vertebra, subsequent encounter for fracture with routine healing: Secondary | ICD-10-CM | POA: Diagnosis not present

## 2020-12-25 DIAGNOSIS — T07XXXA Unspecified multiple injuries, initial encounter: Secondary | ICD-10-CM | POA: Diagnosis present

## 2020-12-25 DIAGNOSIS — S72302D Unspecified fracture of shaft of left femur, subsequent encounter for closed fracture with routine healing: Secondary | ICD-10-CM | POA: Diagnosis present

## 2020-12-25 DIAGNOSIS — S22088D Other fracture of T11-T12 vertebra, subsequent encounter for fracture with routine healing: Secondary | ICD-10-CM

## 2020-12-25 MED ORDER — DIPHENHYDRAMINE HCL 12.5 MG/5ML PO ELIX: 12.5000 mg | ORAL_SOLUTION | ORAL | Status: DC | PRN

## 2020-12-25 MED ORDER — BACITRACIN ZINC 500 UNIT/GM EX OINT
TOPICAL_OINTMENT | Freq: Two times a day (BID) | CUTANEOUS | Status: DC
  Administered 2020-12-26 – 2020-12-31 (×4): 1 via TOPICAL
  Filled 2020-12-25 (×5): qty 28.35

## 2020-12-25 MED ORDER — ACETAMINOPHEN 500 MG PO TABS
1000.0000 mg | ORAL_TABLET | Freq: Three times a day (TID) | ORAL | Status: DC
  Administered 2020-12-25 – 2021-01-01 (×21): 1000 mg via ORAL
  Filled 2020-12-25 (×20): qty 2

## 2020-12-25 MED ORDER — ONDANSETRON 4 MG PO TBDP: 4.0000 mg | ORAL_TABLET | Freq: Four times a day (QID) | ORAL | Status: DC | PRN

## 2020-12-25 MED ORDER — PANTOPRAZOLE SODIUM 40 MG PO TBEC
40.0000 mg | DELAYED_RELEASE_TABLET | Freq: Every day | ORAL | Status: DC
  Administered 2020-12-26 – 2021-01-01 (×7): 40 mg via ORAL
  Filled 2020-12-25 (×7): qty 1

## 2020-12-25 MED ORDER — ENOXAPARIN SODIUM 30 MG/0.3ML IJ SOSY
30.0000 mg | PREFILLED_SYRINGE | Freq: Two times a day (BID) | INTRAMUSCULAR | Status: DC
  Administered 2020-12-25 – 2020-12-27 (×5): 30 mg via SUBCUTANEOUS
  Filled 2020-12-25 (×6): qty 0.3

## 2020-12-25 MED ORDER — FOLIC ACID 1 MG PO TABS
1.0000 mg | ORAL_TABLET | Freq: Every day | ORAL | Status: DC
  Administered 2020-12-26 – 2021-01-01 (×7): 1 mg via ORAL
  Filled 2020-12-25 (×7): qty 1

## 2020-12-25 MED ORDER — ONDANSETRON HCL 4 MG/2ML IJ SOLN: 4.0000 mg | Freq: Four times a day (QID) | INTRAMUSCULAR | Status: DC | PRN

## 2020-12-25 MED ORDER — OXYCODONE HCL 5 MG PO TABS
5.0000 mg | ORAL_TABLET | ORAL | Status: DC | PRN
  Administered 2020-12-25 – 2020-12-26 (×2): 10 mg via ORAL
  Filled 2020-12-25 (×2): qty 2

## 2020-12-25 MED ORDER — THIAMINE HCL 100 MG PO TABS
100.0000 mg | ORAL_TABLET | Freq: Every day | ORAL | Status: DC
Start: 2020-12-26 — End: 2021-01-01
  Administered 2020-12-26 – 2021-01-01 (×7): 100 mg via ORAL
  Filled 2020-12-25 (×7): qty 1

## 2020-12-25 MED ORDER — METHOCARBAMOL 500 MG PO TABS
1000.0000 mg | ORAL_TABLET | Freq: Three times a day (TID) | ORAL | Status: DC
  Administered 2020-12-25 – 2021-01-01 (×21): 1000 mg via ORAL
  Filled 2020-12-25 (×21): qty 2

## 2020-12-25 MED ORDER — BISACODYL 10 MG RE SUPP: 10.0000 mg | Freq: Every day | RECTAL | Status: DC | PRN

## 2020-12-25 MED ORDER — TRAZODONE HCL 50 MG PO TABS: 25.0000 mg | ORAL_TABLET | Freq: Every evening | ORAL | Status: DC | PRN

## 2020-12-25 MED ORDER — POLYETHYLENE GLYCOL 3350 17 G PO PACK
17.0000 g | PACK | Freq: Two times a day (BID) | ORAL | Status: DC
  Administered 2020-12-25 – 2021-01-01 (×4): 17 g via ORAL
  Filled 2020-12-25 (×10): qty 1

## 2020-12-25 MED ORDER — ADULT MULTIVITAMIN W/MINERALS CH
1.0000 | ORAL_TABLET | Freq: Every day | ORAL | Status: DC
  Administered 2020-12-26 – 2021-01-01 (×7): 1 via ORAL
  Filled 2020-12-25 (×7): qty 1
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
## 2020-12-25 NOTE — Progress Notes (Signed)
Inpatient Rehab Admissions Coordinator:   I have a bed for this Pt. On CIR and will plan to admit him today. Rn may call report to [phone].  Megan Salon, MS, CCC-SLP Rehab Admissions Coordinator  [phone] (celll) [phone] (office)

## 2020-12-25 NOTE — Progress Notes (Signed)
Inpatient Rehabilitation Medication Review by a Pharmacist  A complete drug regimen review was completed for this patient to identify any potential clinically significant medication issues.  Clinically significant medication issues were identified:  no  Check AMION for pharmacist assigned to patient if future medication questions/issues arise during this admission.  Pharmacist comments: Docusate PO 100 mg BID and hydromorphone 0.5-1 mg q4h PRN for severe pain score 7-10 uncontrolled by a PO analgesic were the only two medications ordered during the acute admission that were not reordered in CIR. The patient has a both a bowel and pain regimen ordered in CIR, therefore, this is not clinically significant.    Time spent performing this drug regimen review (minutes):  10 minutes   Sanda Klein, PharmD, RPh  PGY-1 Pharmacy Resident 12/25/2020 4:49 PM  Please check [email or web address] for unit-specific pharmacy phone numbers.

## 2020-12-25 NOTE — Progress Notes (Signed)
INPATIENT REHABILITATION ADMISSION NOTE   Arrival Method: via bed      Mental Orientation: Alert and orientedx 3    Assessment: done    Skin: abrasions and incisions to back and arm incision to knee  See wound assessments    IV'S:  22 Gauge iv in L forearm    Pain: no pain    Tubes and Drains:  No apparent    Safety Measures: clamshell and c collar on at all times    Vital Signs: done    Height and Weight: 5 foot 4 and 74.1 kg    Rehab Orientation: done    Family: Willeen Cass notified and on way to bedside for visit     Notes:

## 2020-12-26 ENCOUNTER — Inpatient Hospital Stay (HOSPITAL_COMMUNITY): Payer: Self-pay

## 2020-12-26 DIAGNOSIS — S22000S Wedge compression fracture of unspecified thoracic vertebra, sequela: Secondary | ICD-10-CM

## 2020-12-26 DIAGNOSIS — M7989 Other specified soft tissue disorders: Secondary | ICD-10-CM

## 2020-12-26 NOTE — Progress Notes (Signed)
BLE venous duplex has been completed.  Preliminary findings given to Dauda, RN at [phone].  Results can be found under chart review under CV PROC. 12/26/2020 2:23 PM Dee Paden RVT, RDMS

## 2020-12-26 NOTE — Progress Notes (Signed)
Dopplers revealed left peroneal DVT. Pt on lovenox 30mg  q12. Continue at that dose. Activity as tolerated. He is WBAT LLE.  Re-check dopplers prior to discharge to determine anticoagulation needs.   , MD, [facility] Physical Medicine & Rehabilitation 12/26/2020
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
## 2020-12-27 LAB — CBC
HCT: 33.5 % — ABNORMAL LOW (ref 39.0–52.0)
Hemoglobin: 11.2 g/dL — ABNORMAL LOW (ref 13.0–17.0)
MCH: 30.2 pg (ref 26.0–34.0)
MCHC: 33.4 g/dL (ref 30.0–36.0)
MCV: 90.3 fL (ref 80.0–100.0)
Platelets: 446 10*3/uL — ABNORMAL HIGH (ref 150–400)
RBC: 3.71 MIL/uL — ABNORMAL LOW (ref 4.22–5.81)
RDW: 13.1 % (ref 11.5–15.5)
WBC: 9.3 10*3/uL (ref 4.0–10.5)
nRBC: 0 % (ref 0.0–0.2)

## 2020-12-27 LAB — COMPREHENSIVE METABOLIC PANEL
ALT: 40 U/L (ref 0–44)
AST: 21 U/L (ref 15–41)
Albumin: 3.2 g/dL — ABNORMAL LOW (ref 3.5–5.0)
Alkaline Phosphatase: 94 U/L (ref 38–126)
Anion gap: 4 — ABNORMAL LOW (ref 5–15)
BUN: 12 mg/dL (ref 6–20)
CO2: 29 mmol/L (ref 22–32)
Calcium: 9.3 mg/dL (ref 8.9–10.3)
Chloride: 105 mmol/L (ref 98–111)
Creatinine, Ser: 0.86 mg/dL (ref 0.61–1.24)
GFR, Estimated: >60 mL/min (ref >60)
Glucose, Bld: 106 mg/dL — ABNORMAL HIGH (ref 70–99)
Potassium: 4.1 mmol/L (ref 3.5–5.1)
Sodium: 138 mmol/L (ref 135–145)
Total Bilirubin: 0.6 mg/dL (ref 0.3–1.2)
Total Protein: 6 g/dL — ABNORMAL LOW (ref 6.5–8.1)

## 2020-12-27 NOTE — Progress Notes (Signed)
Inpatient Rehabilitation Center Individual Statement of Services  Patient Name:  Bob Hawkins  Date:  12/27/2020  Welcome to the Inpatient Rehabilitation Center.  Our goal is to provide you with an individualized program based on your diagnosis and situation, designed to meet your specific needs.  With this comprehensive rehabilitation program, you will be expected to participate in at least 3 hours of rehabilitation therapies Monday-Friday, with modified therapy programming on the weekends.  Your rehabilitation program will include the following services:  Physical Therapy (PT), Occupational Therapy (OT), 24 hour per day rehabilitation nursing, Neuropsychology, Care Coordinator, Rehabilitation Medicine, Nutrition Services and Pharmacy Services  Weekly team conferences will be held on Wednesday to discuss your progress.  Your Inpatient Rehabilitation Care Coordinator will talk with you frequently to get your input and to update you on team discussions.  Team conferences with you and your family in attendance may also be held.  Expected length of stay: 7-10 days  Overall anticipated outcome: independent with device  Depending on your progress and recovery, your program may change. Your Inpatient Rehabilitation Care Coordinator will coordinate services and will keep you informed of any changes. Your Inpatient Rehabilitation Care Coordinator's name and contact numbers are listed  below.  The following services may also be recommended but are not provided by the Inpatient Rehabilitation Center:   Driving Evaluations  Home Health Rehabiltiation Services  Outpatient Rehabilitation Services  Vocational Rehabilitation   Arrangements will be made to provide these services after discharge if needed.  Arrangements include referral to agencies that provide these services.  Your insurance has been verified to be:  Med pay-self pay Your primary doctor is:  None  Pertinent information  will be shared with your doctor and your insurance company.  Inpatient Rehabilitation Care Coordinator:  Dossie Der, [place] [phone] or Luna [place]  Information discussed with and copy given to patient by: Lucy Chris, 12/27/2020, 12:40 PM
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
## 2020-12-27 NOTE — Progress Notes (Signed)
Physical Therapy Session Note  Patient Details  Name: Bob Hawkins Sameer Teeple MRN: [id number] Date of Birth: 11/14/1995  Today's Date: 12/27/2020 PT Individual Time: 0815-0855 PT Individual Time Calculation (min): 40 min   Short Term Goals: Week 1:  PT Short Term Goal 1 (Week 1): =LTG due to ELOS  Skilled Therapeutic Interventions/Progress Updates:   Pt received supine in bed and agreeable to PT. Rolling to the R to don TLSO with total A. Supine>sit transfer with mod assist to the LLE and trunk due to pain in the LLE with transfer. Sit>stand in RW with CGA and TDWB on the LLE for pain management. Once in standing, pt reports decreased pain in the LLE. Stand pivot transfer to [facility] with min assist cues for safety and LLE position with transfer int o WC.   WC mobility through hall 2 x 236f with supervision assist and cues for safey through doorways to protect the LLE.   UBE 3 min forward/3 min reverse with random resistance setting. Cued from PT for decreased speed  To <60 RPM to emphasize cardiovascular endurance training and reduce risk of pain through trunk.   [facility] and all needs met.        Therapy Documentation Precautions:  Precautions Precautions: Fall,Cervical,Back Precaution Comments: TLSO when upright and OOB, aspen at all times Required Braces or Orthoses: Cervical Brace,Spinal Brace Cervical Brace: Hard collar,At all times Spinal Brace: Thoracolumbosacral orthotic,Applied in supine position Restrictions Weight Bearing Restrictions: Yes LLE Weight Bearing: Weight bearing as tolerated Vital Signs: Therapy Vitals Temp: 98.3 F (36.8 C) Temp Source: Oral Pulse Rate: 65 Resp: 18 BP: 107/71 Patient Position (if appropriate): Lying Oxygen Therapy SpO2: 98 % O2 Device: Room Air Pain: Denies at rest. 5/10 in the L knee with movement. Pt repositioned to reduce pain in the knee.    Therapy/Group:  Individual Therapy  ALorie Phenix5/30/2022, 8:57 AM
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
## 2020-12-27 NOTE — Progress Notes (Signed)
Physical Therapy Session Note  Patient Details  Name: Bob Hawkins Jesus Markavious Micco MRN: [id number] Date of Birth: Jul 14, 1996  Today's Date: 12/27/2020 PT Individual Time: 1300-1415 PT Individual Time Calculation (min): 75 min   Short Term Goals: Week 1:  PT Short Term Goal 1 (Week 1): =LTG due to ELOS  Skilled Therapeutic Interventions/Progress Updates:    Pain:  Pt reports no pain at rest, L knee pain w/all functional mobility and w/therex, 6/10  Treatment to tolerance.  Rest breaks and repositioning as needed.  Pt initially supine and agreeable to treatment session w/focus on mobility, L knee/hip/ankle ROM.  Pt initially requests to roll to R for donning clamshell TLSO but this is very painful.  Suggested rolling to L, pt w/concerns w/L LE injuries.  Educated pt on safety of this movement.   Pt rolls L w/cues and use of bedrail, minimal pain, TLSO donned by therapist.  In supine, performed the following therex: AAROM hip abd/Add x 12 AAROM hip/knee flexion in limited ROM due to pain TKEs x 25 Hip IR/ER w/knee extensed x 20 Side to sit w/rail and min assist to support LLE. Sit to stand w/RW and cga Gait 18ft w/RW, cues to achieve flatfoot at midstance, discussed tightness developing in L gastrocsoleus w/toe touch vs footflat.  Pt able to tolerate w/practice.  Standing w/mirror for feedback worked on L knee flexion x 10                                                                     Hip + knee flexion x 12                                                                     Standing TKEs w/cues for upright trunk/hip neutral x 15    Static stand no UE support maintaining bilat hip/knee/trunk extension x 2 min w/mirror for feedback, cga and cues.  NuStep w/4 exts for puropose of self AAROM of L knee x 7 min w/progressively increased L knee flexion achieved.  Gait 53ft w/RW, cga as above.  Gait 98ft w/RW, cga to bed, turn/sit w/cga.  Sit to supine w/min assist.  TLSO removed by  therapist.  Pt left supine w/rails up x 3, alarm set, bed in lowest position, and needs in reach.     Therapy Documentation Precautions:  Precautions Precautions: Fall,Cervical,Back Precaution Comments: TLSO when upright and OOB, aspen at all times Required Braces or Orthoses: Cervical Brace,Spinal Brace Cervical Brace: Hard collar,At all times Spinal Brace: Thoracolumbosacral orthotic,Applied in supine position Restrictions Weight Bearing Restrictions: Yes LLE Weight Bearing: Weight bearing as tolerated    Therapy/Group: Individual Therapy  Rada Hay, PT   Shearon Balo 12/27/2020, 3:51 PM

## 2020-12-27 NOTE — Progress Notes (Signed)
Occupational Therapy Session Note  Patient Details  Name: Bob Hawkins MRN: [id number] Date of Birth: 1996/04/22  Today's Date: 12/27/2020 OT Individual Time: 7169-6789 OT Individual Time Calculation (min): 75 min    Short Term Goals: Week 1:  OT Short Term Goal 1 (Week 1): Pt will use LRAD to complete toileting tasks with min A OT Short Term Goal 2 (Week 1): Pt will don LB clothing with min A using LRAD OT Short Term Goal 3 (Week 1): Pt will instruct caregiver in donning TLSO at bed level  Skilled Therapeutic Interventions/Progress Updates:    Pt sitting up in w/c with girlfriend present upon OT arrival.  He reports no pain at rest however does express sharp 8/10 pain with any functional ROM of left knee.  OT clarified with PA, Pam, regarding showering and per her instruction, holding shower level bathing, due to precautions and brace wear schedule.  Therefore, pt completed bathing dressing lower body at w/c sit<>stand level and UB bathing dressing at bed level.    Pt required initial instruction to utilize reacher, sock aide, and long handled sponge to donn/doff boxers, shorts, and socks. Pt then able to complete with supervision.  Pt requested to use toilet and completed needing dependent pericare due to unable to reach without breaking precautions.  Pt able to complete clothing management and toilet transfer using 3 in 1 commode with supervision.  Pt had continent episode of bowel and bladder.  Stand pivot 3 in 1 commode to w/c with supervision and pt self propelled to EOB. Supervision needed for stand pivot using RW w/c to EOB.  Pt required min assist sit to supine.  Mod assist to doff TLSO using log roll technique.  Min assist to bathe UB.    Pts girlfriend demonstrated re-donning of pts TLSO with independence.  Pt requesting to stay in supine due to LLE pain. CP applied to left knee per pt request to address pain.  Call bell in reach, bed alarm on.    Therapy  Documentation Precautions:  Precautions Precautions: Fall,Cervical,Back Precaution Comments: TLSO when upright and OOB, aspen at all times Required Braces or Orthoses: Cervical Brace,Spinal Brace Cervical Brace: Hard collar,At all times Spinal Brace: Thoracolumbosacral orthotic,Applied in supine position Restrictions Weight Bearing Restrictions: Yes LLE Weight Bearing: Weight bearing as tolerated   Therapy/Group: Individual Therapy  Amie Critchley 12/27/2020, 10:04 AM
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
## 2020-12-28 DIAGNOSIS — E46 Unspecified protein-calorie malnutrition: Secondary | ICD-10-CM

## 2020-12-28 DIAGNOSIS — E8809 Other disorders of plasma-protein metabolism, not elsewhere classified: Secondary | ICD-10-CM

## 2020-12-28 DIAGNOSIS — G8918 Other acute postprocedural pain: Secondary | ICD-10-CM

## 2020-12-28 DIAGNOSIS — D62 Acute posthemorrhagic anemia: Secondary | ICD-10-CM

## 2020-12-28 DIAGNOSIS — I82452 Acute embolism and thrombosis of left peroneal vein: Secondary | ICD-10-CM

## 2020-12-28 MED ORDER — ENOXAPARIN SODIUM 80 MG/0.8ML IJ SOSY
75.0000 mg | PREFILLED_SYRINGE | Freq: Two times a day (BID) | INTRAMUSCULAR | Status: DC
  Administered 2020-12-28 – 2020-12-30 (×5): 75 mg via SUBCUTANEOUS
  Filled 2020-12-28 (×5): qty 0.8

## 2020-12-28 MED ORDER — PROSOURCE PLUS PO LIQD
30.0000 mL | Freq: Two times a day (BID) | ORAL | Status: DC
  Administered 2020-12-28 – 2020-12-31 (×7): 30 mL via ORAL
  Filled 2020-12-28 (×8): qty 30

## 2020-12-28 NOTE — Progress Notes (Signed)
Inpatient Rehabilitation  Patient information reviewed and entered into eRehab system by Yulitza Shorts M. Zayleigh Stroh, M.A., CCC/SLP, PPS Coordinator.  Information including medical coding, functional ability and quality indicators will be reviewed and updated through discharge.    
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
## 2020-12-28 NOTE — Progress Notes (Signed)
Physical Therapy Session Note  Patient Details  Name: Bob Hawkins MRN: [id number] Date of Birth: Oct 06, 1995  Today's Date: 12/28/2020 PT Individual Time: 1045-1130 PT Individual Time Calculation (min): 45 min   Today's Date: 12/28/2020 PT Missed Time: 30 Minutes Missed Time Reason: Other (Comment) (temporary hold for LLE DVT)  Short Term Goals: Week 1:  PT Short Term Goal 1 (Week 1): =LTG due to ELOS  Skilled Therapeutic Interventions/Progress Updates:   Received pt supine in bed, pt agreeable to PT treatment, and denied any pain during session but reports increased L knee pain with mobility. Per OT, pt with DVT in LLE. Consulted with charge nurse who stated that per MD, pt with LLE DVT; and recommending no ROM or weight bearing to LLE for today only to allow pt to rest. Pt expressed multiple times during session desire to discharge ASAP an reported his only concerns are his ability to wipe after using restroom but reports his girlfriend would be able to assist with this at home. Discussed equipment and pt reported already having a RW at home. Pt performed the following exercises supine in bed with supervision and verbal cues for technique: -RLE SLR with 2lb ankle weight 2x15 -RLE marches with 3lb ankle weight 2x20 -chest press with 4lb dowel 2x15 -R hip abduction with 2lb ankle weight 2x20 -R ankle PF with blue TB 2x20  -R ankle DF with blue TB 2x20  Provided pt with HEP and educated pt on frequency/duration/technique for the following exercises once cleared by MD for ROM to LLE: -Supine Heel Slide - 1 x daily - 7 x weekly - 2 sets - 10 reps -Supine Hip Abduction - 1 x daily - 7 x weekly - 2 sets - 10 reps -Supine Quadricep Sets - 1 x daily - 7 x weekly - 3 sets - 10 reps - 5 hold Supine Hip Internal and External Rotation - 1 x daily - 7 x weekly - 3 sets - 10 reps -Supine Ankle Pumps - 1 x daily - 7 x weekly - 3 sets - 10 reps Concluded session with pt supine in bed,  needs within reach, and bed alarm on. Provided pt with fresh ice water. 30 minutes missed pf skilled physical therapy.   Therapy Documentation Precautions:  Precautions Precautions: Fall,Cervical,Back Precaution Comments: TLSO when upright and OOB, aspen at all times Required Braces or Orthoses: Cervical Brace,Spinal Brace Cervical Brace: Hard collar,At all times Spinal Brace: Thoracolumbosacral orthotic,Applied in supine position Restrictions Weight Bearing Restrictions: Yes LLE Weight Bearing: Weight bearing as tolerated  Therapy/Group: Individual Therapy Martin Majestic PT, DPT   12/28/2020, 7:21 AM
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
## 2020-12-28 NOTE — Progress Notes (Signed)
Occupational Therapy Session Note  Patient Details  Name: [name] MRN: [id number] Date of Birth: Apr 29, 1996  Today's Date: 12/28/2020 OT Individual Time: 1305-1330 OT Individual Time Calculation (min): 25 min    Short Term Goals: Week 1:  OT Short Term Goal 1 (Week 1): Pt will use LRAD to complete toileting tasks with min A OT Short Term Goal 2 (Week 1): Pt will don LB clothing with min A using LRAD OT Short Term Goal 3 (Week 1): Pt will instruct caregiver in donning TLSO at bed level  Skilled Therapeutic Interventions/Progress Updates:    Pt supine in bed, no c/o pain.  Per MD, hold therapies left lower extremity today due to DVT, therefore session focused on home layout discussion and collaboration.  Per pt, home is one floor with bathroom in hallway and bedroom next door.  Pt has a tub shower with no grab bars or seating areas.  Discussed layout of bathroom in detail with pt.  OT provided pt with measurement handout to be completed by a family member to further assess accessibility of home for future simulations.  Due to pt's cervical and spinal precautions and current LLE mobility restrictions, OT held remainder of session.  Call bell in reach, bed alarm on.   Therapy Documentation Precautions:  Precautions Precautions: Fall,Cervical,Back Precaution Comments: TLSO when upright and OOB, aspen at all times Required Braces or Orthoses: Cervical Brace,Spinal Brace Cervical Brace: Hard collar,At all times Spinal Brace: Thoracolumbosacral orthotic,Applied in supine position Restrictions Weight Bearing Restrictions: Yes LLE Weight Bearing: Weight bearing as tolerated   Therapy/Group: Individual Therapy  Amie Critchley 12/28/2020, 1:49 PM
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
## 2020-12-29 NOTE — Progress Notes (Signed)
Occupational Therapy Session Note  Patient Details  Name: Bob Hawkins MRN: [id number] Date of Birth: [birth year]  Today's Date: 12/29/2020 OT Individual Time: 2563-8937 OT Individual Time Calculation (min): 60 min    Short Term Goals: Week 1:  OT Short Term Goal 1 (Week 1): Pt will use LRAD to complete toileting tasks with min A OT Short Term Goal 2 (Week 1): Pt will don LB clothing with min A using LRAD OT Short Term Goal 3 (Week 1): Pt will instruct caregiver in donning TLSO at bed level  Skilled Therapeutic Interventions/Progress Updates:    Pt seen this session to work on self care. Pt requested help to wash hair. From supine in bed, used washcloths to wash hair as unable to use shampoo tray with his c collar.  Doffed TLSO from supine and worked on [employer] and dressing with set up.  Assist to don TLSO with rolling.  He was having a lot of knee pain with rolling so applied ACE wrap for compression knee support.   Because the MD had not yet visited pt to clear him for LLE activity, worked on [employer] and dressing bed level in which he needed more A as unable to use AE from this position.  Pt then sat up to EOB with min A.  Used RW without LLE wt bearing with S to transfer to wc.  Sat in wc to complete oral care. Pt opted to sit in wc.  Pt in room with all needs met.    Therapy Documentation Precautions:  Precautions Precautions: Fall,Cervical,Back Precaution Comments: TLSO when upright and OOB, aspen at all times Required Braces or Orthoses: Cervical Brace,Spinal Brace Cervical Brace: Hard collar,At all times Spinal Brace: Thoracolumbosacral orthotic,Applied in supine position Restrictions Weight Bearing Restrictions: Yes LLE Weight Bearing: Weight bearing as tolerated    Vital Signs: Therapy Vitals Temp: 98.5 F (36.9 C) Pulse Rate: 68 Resp: 17 BP: (!) 100/58 Patient Position (if appropriate): Lying Oxygen Therapy SpO2: 96 % O2 Device: Room  Air Pain: Pain Assessment Pain Scale: 0-10 Pain Score: 0-No pain ADL: ADL Eating: Supervision/safety Where Assessed-Eating: Bed level Grooming: Contact guard Where Assessed-Grooming: Standing at sink Upper Body Bathing: Supervision/safety Where Assessed-Upper Body Bathing: Bed level Lower Body Bathing: Moderate assistance Where Assessed-Lower Body Bathing: Bed level Upper Body Dressing: Moderate assistance Where Assessed-Upper Body Dressing: Bed level Lower Body Dressing: Maximal assistance Where Assessed-Lower Body Dressing: Bed level Toileting: Maximal assistance Where Assessed-Toileting: Glass blower/designer: Psychiatric nurse Method: Counselling psychologist: Bedside commode   Therapy/Group: Individual Therapy  [place] 12/29/2020, 8:27 AM

## 2020-12-29 NOTE — Progress Notes (Signed)
Physical Therapy Session Note  Patient Details  Name: Bob Hawkins: [id number] Date of Birth: Sep 15, 1995  Today's Date: 12/29/2020 PT Individual Time: 1030-1130 PT Individual Time Calculation (min): 60 min   Short Term Goals: Week 1:  PT Short Term Goal 1 (Week 1): =LTG due to ELOS  Skilled Therapeutic Interventions/Progress Updates:    Patient in w/c in room watching phone with apparatus around his neck for forward viewing.  Patient in w/c not sure of weight bearing on L LE stating OT worked with keeping it NWB.  Patient propelled w/c x 180' to therapy gym.  Noted MD in hallway and questioned him about weight bearing.  Reports allowed today full weight bearing.  Patient performed LAQ on R x 10 and seated ankle AROM x 20.  Sit to stand to RW with CGA to S and cues.  Ambulated x 100' with RW and close S/CGA noting increased speed of movement with RW cues for safety.  Patient seated to rest with L foot propped on stool due to difficulty with flexion in knee with pain.  Demonstrated reverse technique for 2 steps as pt has no rails on entry at home.  Patient performed x 2 trials with min A for walker management.  Patient ambulate 45' with RW and S.  Sit to supine on mat with S pt with crossed leg technique for L LE.  Patient in supine to perform quad sets with hip adduction bilateral x 10 w/ 5 sec hold, SLR with A focus on quad control x 10, hip abduction w/ assist x 10, heel slides on L with theraband to assist x 10.  Patient rolled to R with S increased time, side to sit with min A for L LE.  Patient transferred to w/c with RW and S.  Patient propelled w/c to ortho gym.  Standing at [facility] patient performed dynamic reaching to tap targets over screen x 2 minutes extinguishing dots, then x 5 minutes sequencing numbers and letters.  Patient propelled in w/c to room.  Ambulated to bathroom with S and seated for toileting.  Sit to stand mod I and patient asking for assist for hygiene, NT in to  take over and assist.    Therapy Documentation Precautions:  Precautions Precautions: Fall,Cervical,Back Precaution Comments: TLSO when upright and OOB, aspen at all times Required Braces or Orthoses: Cervical Brace,Spinal Brace Cervical Brace: Hard collar,At all times Spinal Brace: Thoracolumbosacral orthotic,Applied in supine position Restrictions Weight Bearing Restrictions: Yes LLE Weight Bearing: Weight bearing as tolerated Pain: Pain Assessment Pain Scale: 0-10 Pain Score: 7  Pain Type: Acute pain Pain Location: Knee Pain Orientation: Left Pain Descriptors / Indicators: Spasm;Tightness Pain Onset: With Activity Pain Intervention(s): Repositioned;Rest    Therapy/Group: Individual Therapy  Bob Hawkins  Bob Hawkins, PT 12/29/2020, 11:05 AM

## 2020-12-29 NOTE — Progress Notes (Signed)
Patient ID: Bob Hawkins, male   DOB: 04-07-1996, 25 y.o.   MRN: [id number]  Met with pt to inform of team conference goals min-mod/i level due to precautions and brace, discharge date 6/4. He is very pleased with this and is looking forward to going home. Will order 3 in 1 and therapists aware to give home exercise program. Work on discharge needs.

## 2020-12-29 NOTE — Progress Notes (Signed)
Occupational Therapy Session Note  Patient Details  Name: Bob Hawkins MRN: [id number] Date of Birth: 1995-11-25  Today's Date: 12/29/2020 OT Individual Time: 6384-5364 OT Individual Time Calculation (min): 53 min    Short Term Goals: Week 1:  OT Short Term Goal 1 (Week 1): Pt will use LRAD to complete toileting tasks with min A OT Short Term Goal 2 (Week 1): Pt will don LB clothing with min A using LRAD OT Short Term Goal 3 (Week 1): Pt will instruct caregiver in donning TLSO at bed level  Skilled Therapeutic Interventions/Progress Updates:    Pt supine in bed, no pain at rest, agreeable to OT session.  Session focused on adaptive equipment training for LB dressing and toileting using reacher, sock aide, and periwand.  Pt required total assist to donn TLSO at bed level.  Supine to sit with min assist.  Ambulated with RW with supervision.  Pt completed clothing mgt and pericare using periwand with supervision.  Pt ambulated back to EOB and stand to sit with supervision.  Pt doffed/donn socks and shorts using sock aide and reacher with increased time and distant supervision.  Completed sit to supine mod I.  Doffed TLSO at bed level with max assist.  Provided handout of AE to facilitate carryover of procurement and use at home.  Call bell in reach, bed alarm on.  Pt tolerated self care and functional mobility today with increased independence and decreased pain compared to OT session two days prior.  Therapy Documentation Precautions:  Precautions Precautions: Fall,Cervical,Back Precaution Comments: TLSO when upright and OOB, aspen at all times Required Braces or Orthoses: Cervical Brace,Spinal Brace Cervical Brace: Hard collar,At all times Spinal Brace: Thoracolumbosacral orthotic,Applied in supine position Restrictions Weight Bearing Restrictions: Yes LLE Weight Bearing: Weight bearing as tolerated   Therapy/Group: Individual Therapy  Amie Critchley 12/29/2020, 4:00 PM
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
## 2020-12-29 NOTE — Plan of Care (Signed)
  Problem: RH Tub/Shower Transfers Goal: LTG Patient will perform tub/shower transfers w/assist (OT) Description: LTG: Patient will perform tub/shower transfers with assist, with/without cues using equipment (OT) Flowsheets (Taken 12/29/2020 1117) LTG: Pt will perform tub/shower stall transfers with assistance level of: (LTG discontinued as pt is not able to shower at this time due to precautions.) -- Note: LTG discontinued as pt is not able to shower at this time due to precautions.

## 2020-12-30 ENCOUNTER — Other Ambulatory Visit (HOSPITAL_COMMUNITY): Payer: Self-pay

## 2020-12-30 MED ORDER — METHOCARBAMOL 500 MG PO TABS
1000.0000 mg | ORAL_TABLET | Freq: Three times a day (TID) | ORAL | 0 refills | Status: DC
  Filled 2020-12-30: qty 180, 30d supply, fill #0

## 2020-12-30 MED ORDER — BACITRACIN ZINC 500 UNIT/GM EX OINT
TOPICAL_OINTMENT | Freq: Two times a day (BID) | CUTANEOUS | 0 refills | Status: DC
  Filled 2020-12-30: qty 56.8, 10d supply, fill #0

## 2020-12-30 MED ORDER — RIVAROXABAN 20 MG PO TABS
20.0000 mg | ORAL_TABLET | Freq: Every day | ORAL | 0 refills | Status: DC
  Filled 2020-12-30: qty 30, 30d supply, fill #0

## 2020-12-30 MED ORDER — RIVAROXABAN (XARELTO) VTE STARTER PACK (15 & 20 MG)
15.0000 mg | ORAL_TABLET | Freq: Two times a day (BID) | ORAL | 0 refills | Status: DC
  Filled 2020-12-30: qty 51, 30d supply, fill #0

## 2020-12-30 MED ORDER — RIVAROXABAN 20 MG PO TABS: 20.0000 mg | ORAL_TABLET | Freq: Every day | ORAL | Status: DC

## 2020-12-30 MED ORDER — THIAMINE HCL 100 MG PO TABS
100.0000 mg | ORAL_TABLET | Freq: Every day | ORAL | 0 refills | Status: DC
  Filled 2020-12-30: qty 30, 30d supply, fill #0

## 2020-12-30 MED ORDER — ACETAMINOPHEN 500 MG PO TABS
500.0000 mg | ORAL_TABLET | Freq: Three times a day (TID) | ORAL | 0 refills | Status: DC
  Filled 2020-12-30: qty 180, 30d supply, fill #0

## 2020-12-30 MED ORDER — CERTAVITE/ANTIOXIDANTS PO TABS
1.0000 | ORAL_TABLET | Freq: Every day | ORAL | 0 refills | Status: DC
  Filled 2020-12-30: qty 30, 30d supply, fill #0

## 2020-12-30 MED ORDER — FOLIC ACID 1 MG PO TABS
1.0000 mg | ORAL_TABLET | Freq: Every day | ORAL | 0 refills | Status: DC
  Filled 2020-12-30: qty 30, 30d supply, fill #0

## 2020-12-30 MED ORDER — RIVAROXABAN 15 MG PO TABS
15.0000 mg | ORAL_TABLET | Freq: Two times a day (BID) | ORAL | Status: DC
  Administered 2020-12-30 – 2021-01-01 (×5): 15 mg via ORAL
  Filled 2020-12-30 (×5): qty 1

## 2020-12-30 MED ORDER — POLYETHYLENE GLYCOL 3350 17 GM/SCOOP PO POWD
17.0000 g | Freq: Every day | ORAL | 0 refills | Status: DC | PRN
  Filled 2020-12-30: qty 510, 30d supply, fill #0

## 2020-12-30 MED ORDER — TRAMADOL HCL 50 MG PO TABS: 50.0000 mg | ORAL_TABLET | Freq: Four times a day (QID) | ORAL | Status: DC | PRN

## 2020-12-30 MED ORDER — PANTOPRAZOLE SODIUM 40 MG PO TBEC
40.0000 mg | DELAYED_RELEASE_TABLET | Freq: Every day | ORAL | 0 refills | Status: DC
  Filled 2020-12-30: qty 30, 30d supply, fill #0

## 2020-12-30 MED ORDER — PROSOURCE PLUS PO LIQD
30.0000 mL | Freq: Two times a day (BID) | ORAL | 0 refills | Status: DC
  Filled 2020-12-30: qty 887, 15d supply, fill #0

## 2020-12-30 NOTE — Progress Notes (Addendum)
Occupational Therapy Session Note  Patient Details  Name: Bob Hawkins [name] MRN: [id number] Date of Birth: 1995-09-21  Today's Date: 12/30/2020 OT Individual Time: 0905-1000 ; and 1405- 1435 OT Individual Time Calculation (min): 55 min and 30 mins   Short Term Goals: Week 1:  OT Short Term Goal 1 (Week 1): Pt will use LRAD to complete toileting tasks with min A OT Short Term Goal 2 (Week 1): Pt will don LB clothing with min A using LRAD OT Short Term Goal 3 (Week 1): Pt will instruct caregiver in donning TLSO at bed level  Skilled Therapeutic Interventions/Progress Updates:    First session: Pt supine in bed with posterior aspect of TLSO in place.  Pt reports his left knee doesn't hurt as much when he bends it a little.  Pt able to verbally direct OT in proper max assist donning of TLSO.  Pt completed supine to sit to the right to EOB with close supervision with HOB down and no bed rail use.  Pt ambulated around bed using RW to bathroom to retrieve wash cloths and returned to sinkside stand to sit at w/c all with distant supervision.  Pt completed oral hygiene with mod I. Pt completed ADL item retrieval needing stand by to hold objects for BUE use on RW.  Educated pt on use of reacher bag and how to procure. LB dressing and bathing completed with supervision/stand by assist to lock/unlock w/c brakes as needed for sit<>stand. Pt utilized reacher, sock aide, and long handled sponge with mod VCs for problem solving to ensure following of precautions and organization of tasks.  Pt ambulated around bed and stand to sit EOB with supervision.  Sit to supine and rolled to left for doffing of TLSO with total dependence.  Bathed UB with min assist for back.  Pt requesting to remain in supine at end of session, call bell in reach, bed alarm on.  Second Session:  Pt sitting up in w/c, reports pain in LLE is less this afternoon and he has been able to bear more weight when standing.  Pt completed sit to  stand from w/c and ambulated approximately 75 feet to dayroom using RW with supervision. Pt participated in static standing endurance task to complete high table top activity without BUE support and encouraging pt to bear full weight.  Pt tolerated standing x 8 minutes without rest break.  Pt ambulated back to room using RW and stand to sit at recliner with supervision.  BLE elevated, call bell in reach at end of session.    Therapy Documentation Precautions:  Precautions Precautions: Fall,Cervical,Back Precaution Comments: TLSO when upright and OOB, aspen at all times Required Braces or Orthoses: Cervical Brace,Spinal Brace Cervical Brace: Hard collar,At all times Spinal Brace: Thoracolumbosacral orthotic,Applied in supine position Restrictions Weight Bearing Restrictions: Yes LLE Weight Bearing: Weight bearing as tolerated   Therapy/Group: Individual Therapy  Amie Critchley 12/30/2020, 9:19 AM
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
## 2020-12-30 NOTE — Progress Notes (Signed)
Physical Therapy Session Note  Patient Details  Name: Bob Hawkins MRN: [id number] Date of Birth: 06-Nov-1995  Today's Date: 12/30/2020 PT Individual Time: 1500-1530 PT Individual Time Calculation (min): 30 min   Short Term Goals: Week 1:  PT Short Term Goal 1 (Week 1): =LTG due to ELOS  Skilled Therapeutic Interventions/Progress Updates:    Pt received seated in recliner in room, agreeable to PT session. No complaints of pain at rest, has onset of L knee and hip pain with mobility that improves at rest. Sit to stand and stand pivot transfer with RW and Supervision. Sit to supine Supervision to flat bed with use of bedrail. Assisted pt with doffing TLSO while supine in bed. Session focus on LLE strengthening and knee ROM. Supine heel slides with use of gait belt for increased AAROM 2 x 15 reps, hip abd x 5 reps AAROM (stopped due to pain), SAQ 2 x 15 reps. Pt left supine in bed with needs in reach, ice pack in place to L hip/knee region.  Therapy Documentation Precautions:  Precautions Precautions: Fall,Cervical,Back Precaution Comments: TLSO when upright and OOB, aspen at all times Required Braces or Orthoses: Cervical Brace,Spinal Brace Cervical Brace: Hard collar,At all times Spinal Brace: Thoracolumbosacral orthotic,Applied in supine position Restrictions Weight Bearing Restrictions: Yes LLE Weight Bearing: Weight bearing as tolerated   Therapy/Group: Individual Therapy   Peter Congo, PT, DPT, CSRS  12/30/2020, 3:39 PM

## 2020-12-30 NOTE — Progress Notes (Signed)
Physical Therapy Session Note  Patient Details  Name: Bob Hawkins Jesus Tex Conroy MRN: [id number] Date of Birth: August 17, 1995  Today's Date: 12/30/2020 PT Individual Time: 6433-2951 PT Individual Time Calculation (min): 69 min   Short Term Goals: Week 1:  PT Short Term Goal 1 (Week 1): =LTG due to ELOS  Skilled Therapeutic Interventions/Progress Updates:     Pt greeted supine in bed at start of session, agreeable to PT tx. Reports mild L posterior knee pain - rest breaks and mobility provided for pain management as pt denies pain rx. Performed log rolling in bed with supervision and use of bedrail in order to donn clamshell TLSO brace - pt with good ability to direct care. Supine<>Sit completed without assist with HOB slightly elevated. Sit's unsupported with supervision and completes sit<>stand with supervision to RW. Ambulated short distances in room with supervision and RW with limited WBing on LLE due to pain. Pt propelled himself in w/c using BUE's with supervision in hallways to main rehab gym, ~218ft. Stand<>pivot with supervision and RW to mat table. Gait training 268ft + 127ft + 162ft  on level ground with supervision and RW - antalgic gait remains with difficulty producing LLE knee flexion in swing and ~25% weightbearing noted with decreased heel strike - cues throughout for gait corrections. Stair training completed, navigated up/down x4 steps with RW and CGA - needing assistance for stabilizing RW while he navigated up stairs backwards technique - again, pt with good ability to direct care. Remainder of session focused on standing balance and LLE ROM - completed ball toss in unsupported standing in multi-plane directions and then completed ball kick with LLE with RW support with cues needed for increasing L knee flexion to tolerance during swing. Stand<>pivot with supervision and RW back to his w/c and propelled ~259ft back to his room using BUE's and completed stand<>pivot transfer with  supervision and RW. Bed mobility completed with supervision and clamshell TSLO brace removed with maxA while he log rolled in bed. All needs within reach at end of session with bed alarm on.   Therapy Documentation Precautions:  Precautions Precautions: Fall,Cervical,Back Precaution Comments: TLSO when upright and OOB, aspen at all times Required Braces or Orthoses: Cervical Brace,Spinal Brace Cervical Brace: Hard collar,At all times Spinal Brace: Thoracolumbosacral orthotic,Applied in supine position Restrictions Weight Bearing Restrictions: Yes LLE Weight Bearing: Weight bearing as tolerated General:    Therapy/Group: Individual Therapy  Lovis More P Ailee Pates PT 12/30/2020, 7:40 AM

## 2020-12-30 NOTE — Discharge Instructions (Addendum)
Inpatient Rehab Discharge Instructions  Bob Hawkins Bob Hawkins Discharge date and time: 01/01/21   Activities/Precautions/ Functional Status: Activity: no lifting, driving, or strenuous exercise till  cleared by MD Diet: regular diet Wound Care: keep wound clean and dry    Functional status:  ___ No restrictions     ___ Walk up steps independently _X__ 24/7 supervision/assistance   ___ Walk up steps with assistance ___ Intermittent supervision/assistance  ___ Bathe/dress independently ___ Walk with walker     _X__ Bathe/dress with assistance ___ Walk Independently    ___ Shower independently ___ Walk with assistance    ___ Shower with assistance _X__ No alcohol     ___ Return to work/school ________   Special Instructions: 1. Collar has to be on at all times 2. TLSO (thoracic brace needs to be on when at edge of bed and before you get out of bed)    COMMUNITY REFERRALS UPON DISCHARGE:   HOME EXERCISE PROGRAM GIVEN TO PATIENT WILL DO OPPT ONCE WEIGHT BEARING ADVANCED  Medical Equipment/Items Ordered:3 IN 1                                                 Agency/Supplier:[facility]  [phone]  MATCH GIVEN FOR MEDICATION ASSISTANCE    My questions have been answered and I understand these instructions. I will adhere to these goals and the provided educational materials after my discharge from the hospital.  Patient/Caregiver Signature _______________________________ Date __________  Clinician Signature _______________________________________ Date __________  Please bring this form and your medication list with you to all your follow-up doctor's appointments. Information on my medicine - XARELTO (rivaroxaban)   WHY WAS XARELTO PRESCRIBED FOR YOU? Xarelto was prescribed to treat blood clots that may have been found in the veins of your legs (deep vein thrombosis) or in your lungs (pulmonary embolism) and to reduce the risk of them occurring again.  What do  you need to know about Xarelto? The starting dose is one 15 mg tablet taken TWICE daily with food for the FIRST 21 DAYS then on (enter date)  01/20/2022 PM  the dose is changed to one 20 mg tablet taken ONCE A DAY with your evening meal.  DO NOT stop taking Xarelto without talking to the health care provider who prescribed the medication.  Refill your prescription for 20 mg tablets before you run out.  After discharge, you should have regular check-up appointments with your healthcare provider that is prescribing your Xarelto.  In the future your dose may need to be changed if your kidney function changes by a significant amount.  What do you do if you miss a dose? If you are taking Xarelto TWICE DAILY and you miss a dose, take it as soon as you remember. You may take two 15 mg tablets (total 30 mg) at the same time then resume your regularly scheduled 15 mg twice daily the next day.  If you are taking Xarelto ONCE DAILY and you miss a dose, take it as soon as you remember on the same day then continue your regularly scheduled once daily regimen the next day. Do not take two doses of Xarelto at the same time.   Important Safety Information Xarelto is a blood thinner medicine that can cause bleeding. You should call your healthcare provider right away if you experience any  of the following: ? Bleeding from an injury or your nose that does not stop. ? Unusual colored urine (red or dark brown) or unusual colored stools (red or black). ? Unusual bruising for unknown reasons. ? A serious fall or if you hit your head (even if there is no bleeding).  Some medicines may interact with Xarelto and might increase your risk of bleeding while on Xarelto. To help avoid this, consult your healthcare provider or pharmacist prior to using any new prescription or non-prescription medications, including herbals, vitamins, non-steroidal anti-inflammatory drugs (NSAIDs) and supplements.  This website has  more information on Xarelto: VisitDestination.com.br.

## 2020-12-31 ENCOUNTER — Other Ambulatory Visit (HOSPITAL_COMMUNITY): Payer: Self-pay

## 2020-12-31 LAB — CBC
HCT: 36.6 % — ABNORMAL LOW (ref 39.0–52.0)
Hemoglobin: 12.2 g/dL — ABNORMAL LOW (ref 13.0–17.0)
MCH: 30.2 pg (ref 26.0–34.0)
MCHC: 33.3 g/dL (ref 30.0–36.0)
MCV: 90.6 fL (ref 80.0–100.0)
Platelets: 579 10*3/uL — ABNORMAL HIGH (ref 150–400)
RBC: 4.04 MIL/uL — ABNORMAL LOW (ref 4.22–5.81)
RDW: 13.6 % (ref 11.5–15.5)
WBC: 6.6 10*3/uL (ref 4.0–10.5)
nRBC: 0 % (ref 0.0–0.2)

## 2020-12-31 NOTE — Progress Notes (Addendum)
Occupational Therapy Session Note  Patient Details  Name: Bob Hawkins MRN: [id number] Date of Birth: 07-05-1996  Today's Date: 12/31/2020 OT Individual Time: 1035-1130 OT Individual Time Calculation (min): 55 min    Short Term Goals: Week 1:  OT Short Term Goal 1 (Week 1): Pt will use LRAD to complete toileting tasks with min A OT Short Term Goal 2 (Week 1): Pt will don LB clothing with min A using LRAD OT Short Term Goal 3 (Week 1): Pt will instruct caregiver in donning TLSO at bed level  Skilled Therapeutic Interventions/Progress Updates:    Pt sitting up in w/c with TLSO donned, reports 5-6/10 pain with mobility of LLE however no pain at rest.  Pt completed sinkside self care including oral hygiene, LB bathing and dressing using adaptive equipment (sock aide, reacher, long handled sponge, periwand) with setup (Pt did retrieve some items using walker bag provided by OT, however pt forgot a few items needs assist to setup).  Pt self propelled w/c to ADL suite with independence and participated in education regarding environmental modifications and body mechanics to promote independence and follow through of spinal precautions during meal preperation and item retrieval.  Pt completed sit<>stand and retrieved item out of refrigerator using RW then returned to w/c with distant supervision.  Pt self propelled back to room with independence.  Call bell in reach at end of session.  Therapy Documentation Precautions:  Precautions Precautions: Fall,Cervical,Back Precaution Booklet Issued: No Precaution Comments: TLSO when upright and OOB, aspen at all times Required Braces or Orthoses: Cervical Brace,Spinal Brace Cervical Brace: Hard collar,At all times Spinal Brace: Thoracolumbosacral orthotic,Applied in supine position Restrictions Weight Bearing Restrictions: Yes LLE Weight Bearing: Weight bearing as tolerated   Therapy/Group: Individual Therapy  Amie Critchley 12/31/2020,  12:38 PM

## 2020-12-31 NOTE — Progress Notes (Signed)
PROGRESS NOTE   Subjective/Complaints: Patient seen laying in bed this morning.  He states he slept well overnight.  He is excited about discharge tomorrow.  ROS: Denies CP, SOB, N/V/D  Objective:   No results found. Recent Labs    12/31/20 0553  WBC 6.6  HGB 12.2*  HCT 36.6*  PLT 579*   No results for input(s): NA, K, CL, CO2, GLUCOSE, BUN, CREATININE, CALCIUM in the last 72 hours.  Intake/Output Summary (Last 24 hours) at 12/31/2020 1016 Last data filed at 12/31/2020 0823 Gross per 24 hour  Intake 720 ml  Output 1475 ml  Net -755 ml        Physical Exam: Vital Signs Blood pressure 116/60, pulse 65, temperature 99.2 F (37.3 C), temperature source Oral, resp. rate 18, weight 74.1 kg, SpO2 97 %.  Constitutional: No distress . Vital signs reviewed. HENT: Normocephalic.  Atraumatic. Neck: + C-collar. Eyes: EOMI. No discharge.  Scleral hemorrhages, improving. Cardiovascular: No JVD.  RRR. Respiratory: Normal effort.  No stridor.  Bilateral clear to auscultation. GI: Non-distended.  BS +. Skin: Warm and dry.   Scattered abrasions. Psych: Normal mood.  Normal behavior. Musc: No edema in extremities.  Tenderness to left knee, improving. + TLSO. Neurological: Alert Motor: Bilateral upper extremities, right lower extremity: 4+/5/5 throughout Left lower extremity: Hip flexion, knee extension 3-/5, ankle dorsiflexion 5/5 (some pain inhibition), improving  Assessment/Plan: 1. Functional deficits which require 3+ hours per day of interdisciplinary therapy in a comprehensive inpatient rehab setting.  Physiatrist is providing close team supervision and 24 hour management of active medical problems listed below.  Physiatrist and rehab team continue to assess barriers to discharge/monitor patient progress toward functional and medical goals  Care Tool:  Bathing    Body parts bathed by patient: Left arm,Right  arm,Chest,Abdomen,Front perineal area,Right upper leg,Left upper leg,Face   Body parts bathed by helper: Buttocks,Right lower leg,Left lower leg     Bathing assist Assist Level: Moderate Assistance - Patient 50 - 74%     Upper Body Dressing/Undressing Upper body dressing   What is the patient wearing?: Pull over shirt    Upper body assist Assist Level: Moderate Assistance - Patient 50 - 74%    Lower Body Dressing/Undressing Lower body dressing      What is the patient wearing?: Underwear/pull up,Pants     Lower body assist Assist for lower body dressing: Maximal Assistance - Patient 25 - 49%     Toileting Toileting    Toileting assist Assist for toileting: Independent with assistive device Assistive Device Comment: urinal   Transfers Chair/bed transfer  Transfers assist     Chair/bed transfer assist level: Supervision/Verbal cueing     Locomotion Ambulation   Ambulation assist      Assist level: Contact Guard/Touching assist Assistive device: Walker-rolling Max distance: 100'   Walk 10 feet activity   Assist     Assist level: Contact Guard/Touching assist Assistive device: Walker-rolling   Walk 50 feet activity   Assist    Assist level: Contact Guard/Touching assist Assistive device: Walker-rolling    Walk 150 feet activity   Assist    Assist level: Contact Guard/Touching assist Assistive device:  Walker-rolling    Walk 10 feet on uneven surface  activity   Assist Walk 10 feet on uneven surfaces activity did not occur: Safety/medical concerns         Wheelchair     Assist Will patient use wheelchair at discharge?: Yes Type of Wheelchair: Manual    Wheelchair assist level: Supervision/Verbal cueing Max wheelchair distance: 200'    Wheelchair 50 feet with 2 turns activity    Assist        Assist Level: Supervision/Verbal cueing   Wheelchair 150 feet activity     Assist      Assist Level:  Supervision/Verbal cueing    Medical Problem List and Plan: 1.  Functional and mobility deficits secondary to polytrauma. Accident resulted in +LOC but patient without residual clinical signs of TBI  Continue CIR, patient and family education 2.  Antithrombotics: -DVT/anticoagulation:  Pharmaceutical: Lovenox, changed to therapeutic dose on 5/31, transitioned to Xarelto on 6/2  Left peroneal vein DVT             -antiplatelet therapy: n/a 3. Pain Management: tylenol and oxycodone prn for pain             -robaxin for muscle spasms             -prn ICE  Controlled with meds on 6/3 4. Mood: team to provide ego support as necessary             -antipsychotic agents: n/a 5. Neuropsych: This patient is capable of making decisions on his own behalf. 6. Skin/Wound Care: local wound care as needed 7. Fluids/Electrolytes/Nutrition: encourage po 8. Left diaphyseal femur fx s/p IMN 5/20             WBAT 9. Left lateral meniscal tear with avulsion fx             -consvt care, outpt ortho f/u 10. Comminuted left C2 pedicle fx--[place]en Collar AT ALL TIMES 11. T6/7 compression fx, T11 superior endplate fx, L1 compression fx             -TLSO when upright or OOB 12. Left 6th rib fx, sternal fx---pain mgt 13. Bilateral intraorbital hematomas, maxillary sinus hemorrhage             -pt denies any eye pain or visual loss             -no ophthalmology f/u recommended 14.  Hypoalbuminemia  Supplement initiated on 5/31 15.  Acute blood loss anemia  Hemoglobin 12.2 on 6/3  Continue to monitor  LOS: 6 days A FACE TO FACE EVALUATION WAS PERFORMED  Nethra Mehlberg Karis Juba 12/31/2020, 10:16 AM

## 2020-12-31 NOTE — Discharge Summary (Signed)
Physical Therapy Discharge Summary  Patient Details  Name: Bob Hawkins Jesus Vitor Overbaugh MRN: [id number] Date of Birth: [birth year]  Today's Date: 12/31/2020 PT Individual Time: 0900-1000 PT Individual Time Calculation (min): 60 min    Patient has met 10 of 10 long term goals due to improved activity tolerance, improved balance, increased strength, decreased pain and ability to compensate for deficits.  Patient to discharge at an ambulatory level Modified Independent.   Patient's care partner is independent to provide the necessary physical assistance at discharge.  Reasons goals not met: n/a  Recommendation:  Patient will benefit from ongoing skilled PT services in outpatient setting to continue to advance safe functional mobility, address ongoing impairments in LLE weakness/ROM impairments, gait deficits, in order to minimize fall risk.  Equipment: No equipment provided. Pt reports he owns a RW.  Reasons for discharge: treatment goals met and discharge from hospital  Patient/family agrees with progress made and goals achieved: Yes  PT Discharge Precautions/Restrictions Precautions Precautions: Fall;Cervical;Back Precaution Booklet Issued: No Precaution Comments: TLSO when upright and OOB, aspen at all times Required Braces or Orthoses: Cervical Brace;Spinal Brace Cervical Brace: Hard collar;At all times Spinal Brace: Thoracolumbosacral orthotic;Applied in supine position Restrictions Weight Bearing Restrictions: Yes LLE Weight Bearing: Weight bearing as tolerated Vital Signs Therapy Vitals Temp: 99.2 F (37.3 C) Temp Source: Oral Pulse Rate: 65 Resp: 18 BP: 116/60 Patient Position (if appropriate): Lying Oxygen Therapy SpO2: 97 % O2 Device: Room Air Vision/Perception  Perception Perception: Within Functional Limits Praxis Praxis: Intact  Cognition Overall Cognitive Status: Within Functional Limits for tasks assessed Arousal/Alertness: Awake/alert Orientation  Level: Oriented X4 Attention: Focused;Sustained Focused Attention: Appears intact Sustained Attention: Appears intact Selective Attention: Appears intact Memory: Appears intact Awareness: Appears intact Problem Solving: Appears intact Safety/Judgment: Appears intact Sensation Sensation Light Touch: Appears Intact Hot/Cold: Appears Intact Proprioception: Appears Intact Stereognosis: Appears Intact Coordination Gross Motor Movements are Fluid and Coordinated: No Fine Motor Movements are Fluid and Coordinated: Yes Coordination and Movement Description: Impacted by pain from polytrauma Finger Nose Finger Test: [facility] Heel Shin Test: UTA 2/2 LLE pain Motor  Motor Motor - Discharge Observations: Motor movement patterns improved since date of evaluation - limited most by pain and spinal bracing  Mobility Bed Mobility Rolling Right: Independent with assistive device Rolling Left: Independent with assistive device Supine to Sit: Independent with assistive device Sit to Supine: Independent with assistive device Transfers Transfers: Sit to Stand;Stand to Sit;Stand Pivot Transfers Sit to Stand: Independent with assistive device Stand to Sit: Independent with assistive device Stand Pivot Transfers: Independent with assistive device Transfer (Assistive device): Rolling walker Locomotion  Gait Ambulation: Yes Gait Assistance: Independent with assistive device Gait Distance (Feet): 250 Feet Assistive device: Rolling walker Gait Gait: Yes Gait Pattern: Impaired Gait Pattern: Decreased weight shift to left;Antalgic;Step-to pattern;Decreased hip/knee flexion - left;Decreased stance time - left;Left circumduction Stairs / Additional Locomotion Stairs: Yes Stairs Assistance: Minimal Assistance - Patient > 75% (for stabilizing RW) Stair Management Technique: No rails;With walker Number of Stairs: 4 Height of Stairs: 6 Wheelchair Mobility Wheelchair Mobility: Yes Wheelchair Assistance:  Independent with Camera operator: Both upper extremities Wheelchair Parts Management: Needs assistance Distance: 250  Trunk/Postural Assessment  Cervical Assessment Cervical Assessment: Exceptions to WFL (C2 fx) Thoracic Assessment Thoracic Assessment:  (T6/7 and T11 fx - UTA) Lumbar Assessment Lumbar Assessment: Exceptions to WFL (L1 fx - UTA) Postural Control Postural Control: Within Functional Limits (Limited most by pain from polytrauam)  Balance Balance Balance Assessed: Yes Static Sitting  Balance Static Sitting - Balance Support: Feet supported Static Sitting - Level of Assistance: 7: Independent Dynamic Sitting Balance Dynamic Sitting - Balance Support: Feet supported;During functional activity Dynamic Sitting - Level of Assistance: 7: Independent Static Standing Balance Static Standing - Balance Support: No upper extremity supported Static Standing - Level of Assistance: 6: Modified independent (Device/Increase time) Dynamic Standing Balance Dynamic Standing - Balance Support: During functional activity Dynamic Standing - Level of Assistance: 6: Modified independent (Device/Increase time) Dynamic Standing - Balance Activities: Kicking ball;Ball toss Extremity Assessment  RLE Assessment RLE Assessment: Within Functional Limits General Strength Comments: Grossly 5/5 LLE Assessment LLE Assessment: Exceptions to [facility] General Strength Comments: impaired due to pain LLE Strength Left Hip Flexion: 2+/5 Left Knee Flexion: 2/5 Left Knee Extension: 3/5 Left Ankle Dorsiflexion: 5/5   *LLE MMT assessment limited by pain  Skilled Intervention:  Pt greeted supine in bed at start of session. Denies resting pain and is agreeable to PT tx. Pt with back of TLSO already on when PT arrived. Donned clamshell TLSO while supine in bed with maxA with pt able to direct care appropriately. Supine<>sit mod I without bed features, required ++ time due to pain.  Sit<>stand mod I to RW and ambulatory transfer with RW mod I to w/c. Pt propelled himself in w/c >323ft to ortho gym mod I using BUE's. Completed Car transfer with supervision with car height set to simulate [employer] - increased time needed due to difficulty producing trunk flexion from clamshell bracing. He then ambulated up/down 82ft ramp mod I with RW and ambulated to ADL apartment room mod I with RW, ~115ft. Gait deficits described above. Performed furniture transfer from low sitting sofa couch mod I to RW and then ambulated to main rehab gym mod I with RW. Completed stair training where he navigated up/down x4 steps with RW and minA (for stabilizing RW) as he stepped up backwards during ascent and forward during descent - pt continues to demonstrated good ability to direct care for this functional task. Completed supine there-ex on mat table: -1x10 LLE hip abduction (AAROM to tolerance) -1x15 LLE quad sets -1x15 LLE heel slides (AAROM to tolerance)  Pt propelled himself back to his room, >344ft, mod I in w/c. Requesting to use bathroom and ambulated in room mod I with RW. Continent of bladder while standing with RW support and then ambulated back to his w/c with RW. Agreeable to remain seated upright in w/c for upcoming OT session. All needs met and items within reach.   Arvon Schreiner P Eulalia Ellerman PT, DPT 12/31/2020, 7:45 AM

## 2020-12-31 NOTE — Progress Notes (Signed)
Inpatient Rehabilitation Care Coordinator Discharge Note  The overall goal for the admission was met for: DC Sat 6/4  Discharge location: Yes-Home with girlfriend and other family members assisting 24/7  Length of Stay: Yes-7 days  Discharge activity level: Yes-CGA-supervision level   Home/community participation: Yes  Services provided included: MD, RD, PT, OT, RN, CM, Pharmacy and [place]: Other: self pay-med pay  Choices offered to/list presented to:pt and girlfriend  Follow-up services arranged: DME: [employer]-3 in 1 has rolling walker from family member and Patient/Family has no preference for HH/DME agencies Gave home exercise program will do OP therapies once can WB. Match for prescription assistance and set up with [facility] for PCP follow up-7/7 @ 2:00 pm  Comments (or additional information):Grilfriend and other family members can provide 24/7 care. Comfortable with care needs.   Patient/Family verbalized understanding of follow-up arrangements: Yes  Individual responsible for coordination of the follow-up plan: self and Cynthia-girlfriend [phone]  Confirmed correct DME delivered: Elease Hashimoto 12/31/2020    Rayanna Matusik, Gardiner Rhyme

## 2020-12-31 NOTE — Progress Notes (Signed)
Occupational Therapy Session Note  Patient Details  Name: Bob Hawkins Bob Hawkins MRN: [id number] Date of Birth: Dec 29, 1995  Today's Date: 12/31/2020 OT Individual Time: 1415-1500 OT Individual Time Calculation (min): 45 min    Short Term Goals: Week 1:  OT Short Term Goal 1 (Week 1): Pt will use LRAD to complete toileting tasks with min A OT Short Term Goal 2 (Week 1): Pt will don LB clothing with min A using LRAD OT Short Term Goal 3 (Week 1): Pt will instruct caregiver in donning TLSO at bed level  Skilled Therapeutic Interventions/Progress Updates:    Pt received supine in bed and consented to OT tx. Pt assisted with back brace, then sat EOB with SUP and cuing for log roll technique. Pt transferred from EOB to w/c SPT with no device with SUP. Pt wheeled in w/c from room to outside with distant SUP to navigate around obstacles and over various terrain. Pt instructed in light BUE HEP to increase strength and activity tolerance for ADLs within spinal precautions and also within sternal precautions to avoid pain. Instructed pt in orange theraband internal and external shoulder rotation exercises, elbow flexion and extension, and 3#db for shoulder flexion for 3x15 with rest breaks in between exercises due to fatigue. After tx, pt wheeled self back to room with SUP, SPT to EOB with SUP, left EOB and setup with lunch tray with all needs met, bed exit alarm on.  Therapy Documentation Precautions:  Precautions Precautions: Fall,Cervical,Back Precaution Booklet Issued: No Precaution Comments: TLSO when upright and OOB, aspen at all times Required Braces or Orthoses: Cervical Brace,Spinal Brace Cervical Brace: Hard collar,At all times Spinal Brace: Thoracolumbosacral orthotic,Applied in supine position Restrictions Weight Bearing Restrictions: Yes LLE Weight Bearing: Weight bearing as tolerated    Vital Signs: Therapy Vitals Temp: 98 F (36.7 C) Temp Source: Oral Pulse Rate: 71 Resp:  17 BP: 119/65 Patient Position (if appropriate): Lying Oxygen Therapy SpO2: 98 % O2 Device: Room Air Pain: Pain Assessment Pain Scale: 0-10 Pain Score: 6  Pain Type: Acute pain Pain Location: Knee Pain Orientation: Left Pain Descriptors / Indicators: Sore Pain Onset: With Activity Pain Intervention(s): Rest;Repositioned   Therapy/Group: Individual Therapy  Oswaldo Cueto 12/31/2020, 3:23 PM
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
## 2021-01-01 NOTE — Progress Notes (Signed)
Patient and significant other stated that they understood all discharge instructions. Stated that they had no questions. Patient discharged. Cletis Media, LPN

## 2021-01-03 ENCOUNTER — Telehealth: Payer: Self-pay
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
## 2021-01-03 NOTE — Discharge Summary (Addendum)
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 January 20, 2021 Notes to patient: This is to start after starter pack is completed        Follow-up Information     Anders Simmonds, PA-C Follow up on 02/03/2021.   Specialty: Family Medicine Why: Appointment @ 2:00 PM Contact information: [address] [place] [address] [phone]         Kathryne Hitch, MD. Call.   Specialty: Orthopedic Surgery Why: for follow up appointment Contact information: [address] [place] [address] [phone]         Marcello Fennel, MD Follow up.   Specialty: Physical Medicine and Rehabilitation Why: Office will  call you with follow up appointment Contact information: [address] [place] [address] [phone]         Lisbeth Renshaw, MD. Call on 01/03/2021.   Specialty: Neurosurgery Why: for follow up in 7-10 days Contact information: [address] [place] [address] [phone]                 Signed: Mcarthur Rossetti Angiulli 01/03/2021, 5:19 AM

## 2021-01-10 ENCOUNTER — Other Ambulatory Visit (HOSPITAL_COMMUNITY): Payer: Self-pay

## 2021-01-11 ENCOUNTER — Other Ambulatory Visit: Payer: Self-pay

## 2021-01-11 ENCOUNTER — Encounter: Payer: Self-pay | Admitting: Registered Nurse

## 2021-01-11 ENCOUNTER — Encounter: Payer: Self-pay | Attending: Registered Nurse | Admitting: Registered Nurse

## 2021-01-11 VITALS — BP 106/72 | HR 96 | Temp 98.3°F | Ht 66.0 in | Wt 166.8 lb

## 2021-01-11 DIAGNOSIS — S129XXA Fracture of neck, unspecified, initial encounter: Secondary | ICD-10-CM | POA: Insufficient documentation

## 2021-01-11 DIAGNOSIS — S22009A Unspecified fracture of unspecified thoracic vertebra, initial encounter for closed fracture: Secondary | ICD-10-CM | POA: Insufficient documentation

## 2021-01-11 DIAGNOSIS — S7292XA Unspecified fracture of left femur, initial encounter for closed fracture: Secondary | ICD-10-CM | POA: Insufficient documentation

## 2021-01-11 DIAGNOSIS — T07XXXA Unspecified multiple injuries, initial encounter: Secondary | ICD-10-CM

## 2021-01-11 DIAGNOSIS — F17210 Nicotine dependence, cigarettes, uncomplicated: Secondary | ICD-10-CM | POA: Insufficient documentation
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
## 2021-01-11 NOTE — Patient Instructions (Signed)
Call Dr Magnus Ivan to Schedule appointment  : [address] [place], [place] [address] [phone]   Orthopedic Surgeon   Call Dr. Lisbeth Renshaw : Call to Schedule Appointment Neurosurgery [address]  [place], [place] [address]  [phone]

## 2021-02-03 ENCOUNTER — Inpatient Hospital Stay: Payer: Self-pay | Admitting: Physician Assistant

## 2021-02-18 ENCOUNTER — Other Ambulatory Visit (HOSPITAL_COMMUNITY): Payer: Self-pay

## 2021-02-24 ENCOUNTER — Inpatient Hospital Stay: Payer: Self-pay | Admitting: Physician Assistant
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
## 2021-03-23 ENCOUNTER — Encounter: Payer: Self-pay | Attending: Registered Nurse | Admitting: Physical Medicine & Rehabilitation

## 2021-03-23 DIAGNOSIS — S129XXA Fracture of neck, unspecified, initial encounter: Secondary | ICD-10-CM | POA: Insufficient documentation

## 2021-03-23 DIAGNOSIS — T07XXXA Unspecified multiple injuries, initial encounter: Secondary | ICD-10-CM | POA: Insufficient documentation

## 2021-03-23 DIAGNOSIS — S22009A Unspecified fracture of unspecified thoracic vertebra, initial encounter for closed fracture: Secondary | ICD-10-CM | POA: Insufficient documentation

## 2021-03-23 DIAGNOSIS — S7292XA Unspecified fracture of left femur, initial encounter for closed fracture: Secondary | ICD-10-CM | POA: Insufficient documentation

## 2021-03-23 DIAGNOSIS — F17210 Nicotine dependence, cigarettes, uncomplicated: Secondary | ICD-10-CM | POA: Insufficient documentation
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
## 2022-10-25 IMAGING — RF DG FEMUR 2+V*L*
1 series · 7 of 7 positions shown · non-contrast
Comparison: Films from earlier in the same day.

CLINICAL DATA: Known left femur fracture

EXAM:
DG C-ARM 1-60 MIN; LEFT FEMUR 2 VIEWS

[Series 1: run · 7 of 7 slices shown]
[im 1/7]
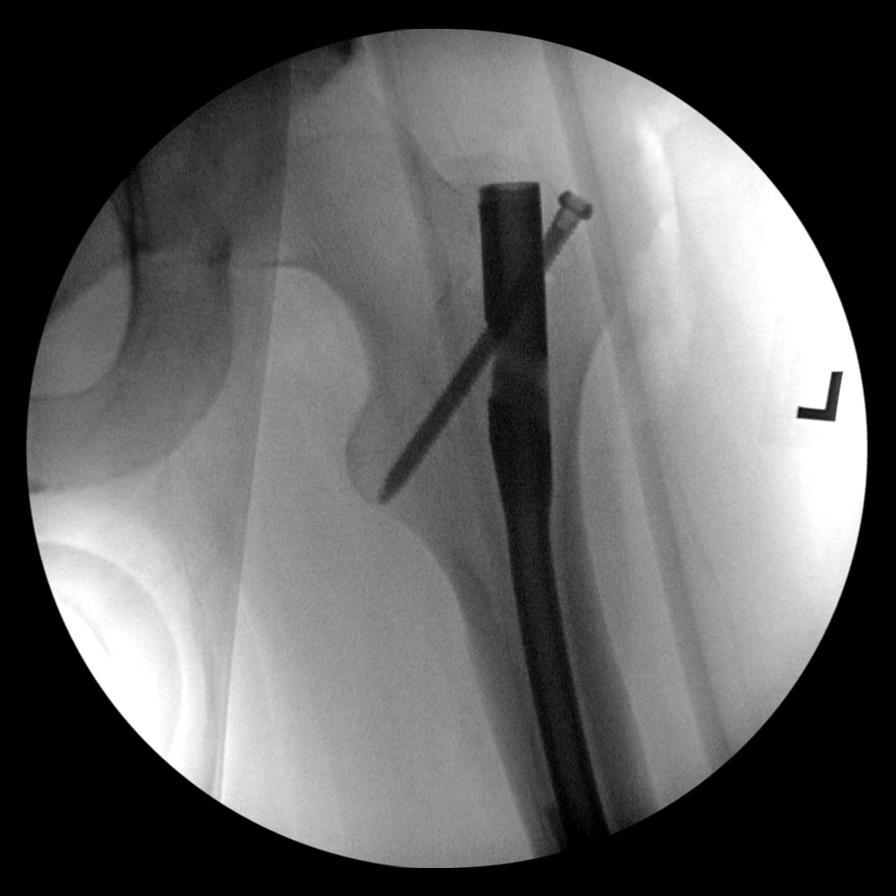
[im 2/7]
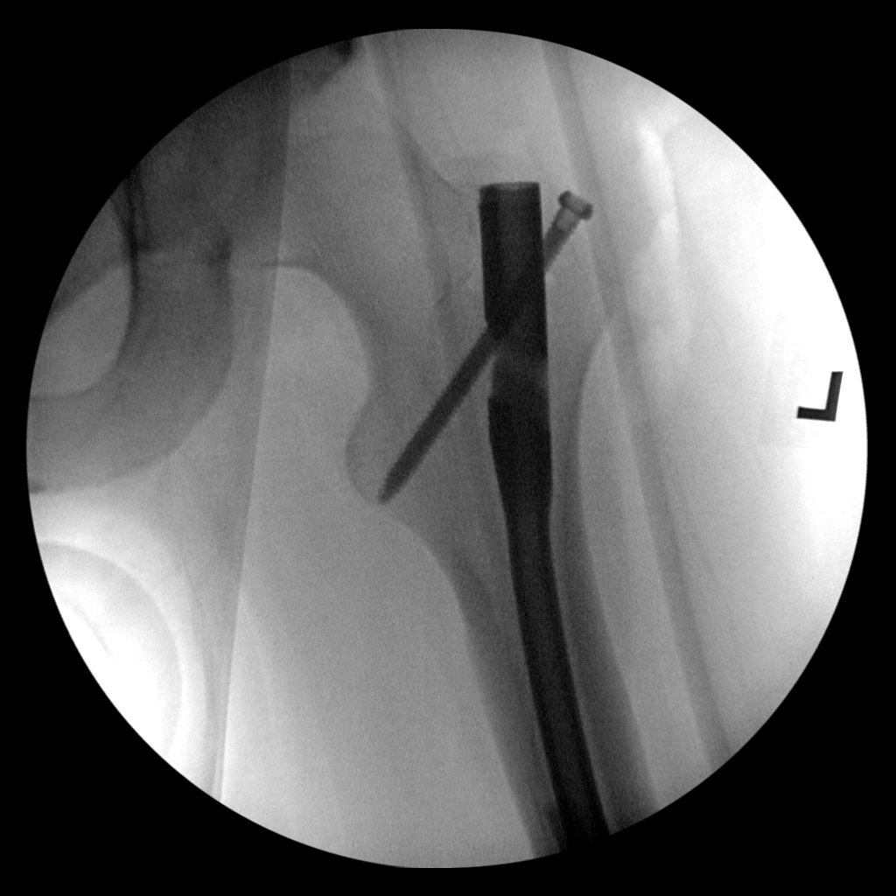
[im 3/7]
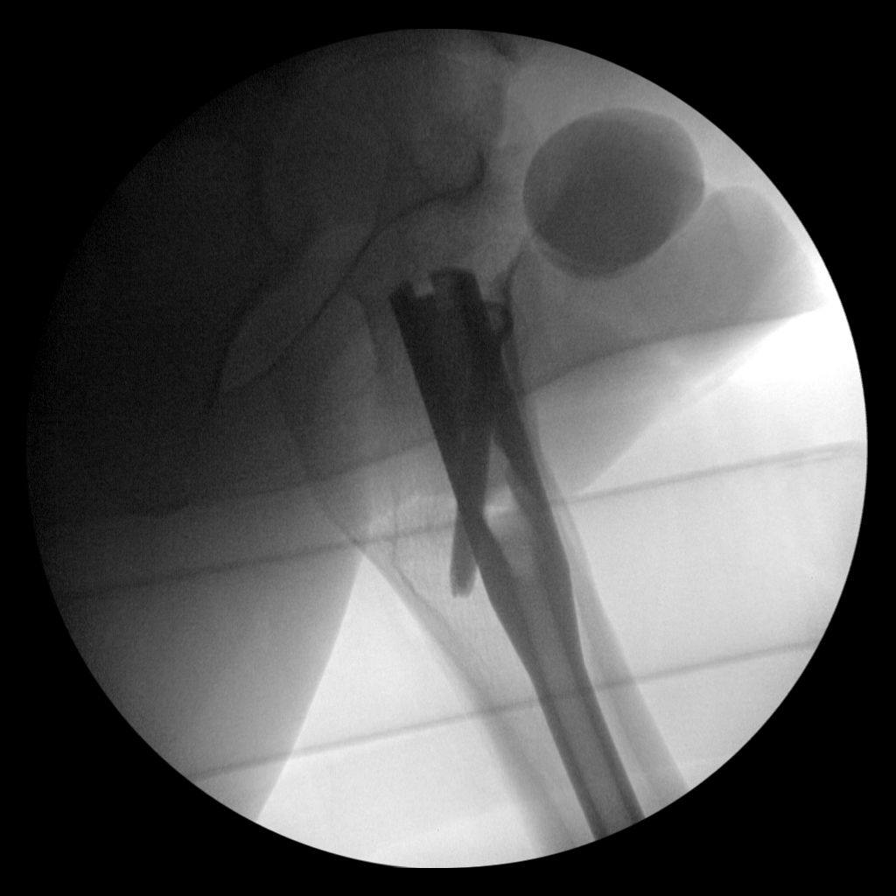
[im 4/7]
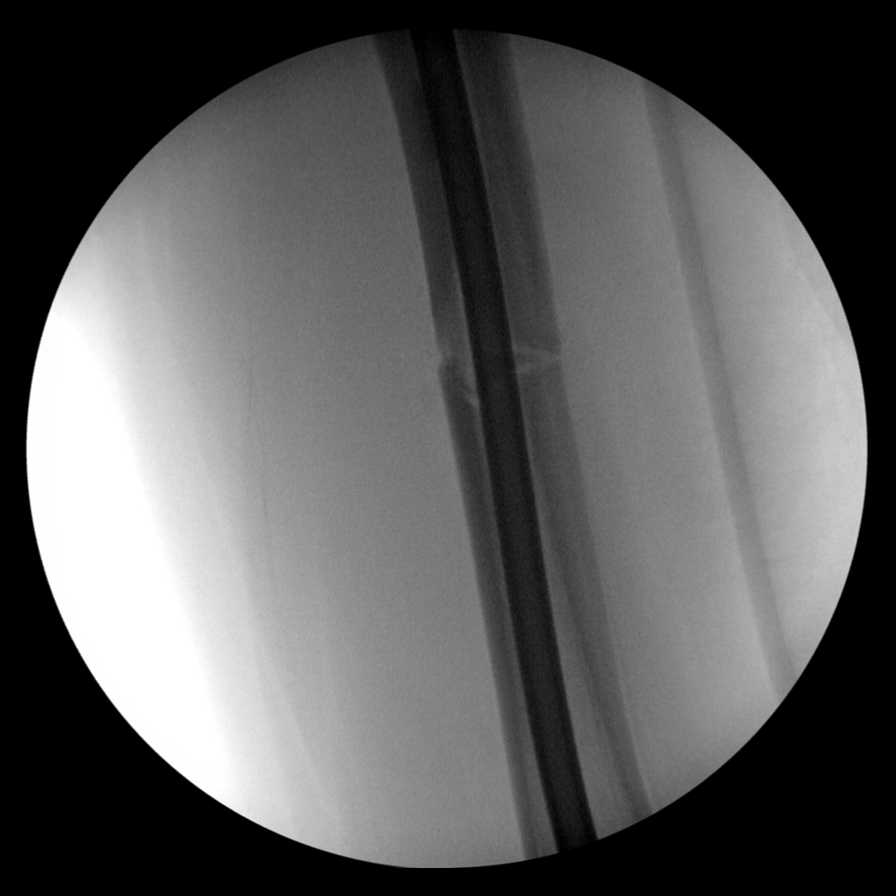
[im 5/7]
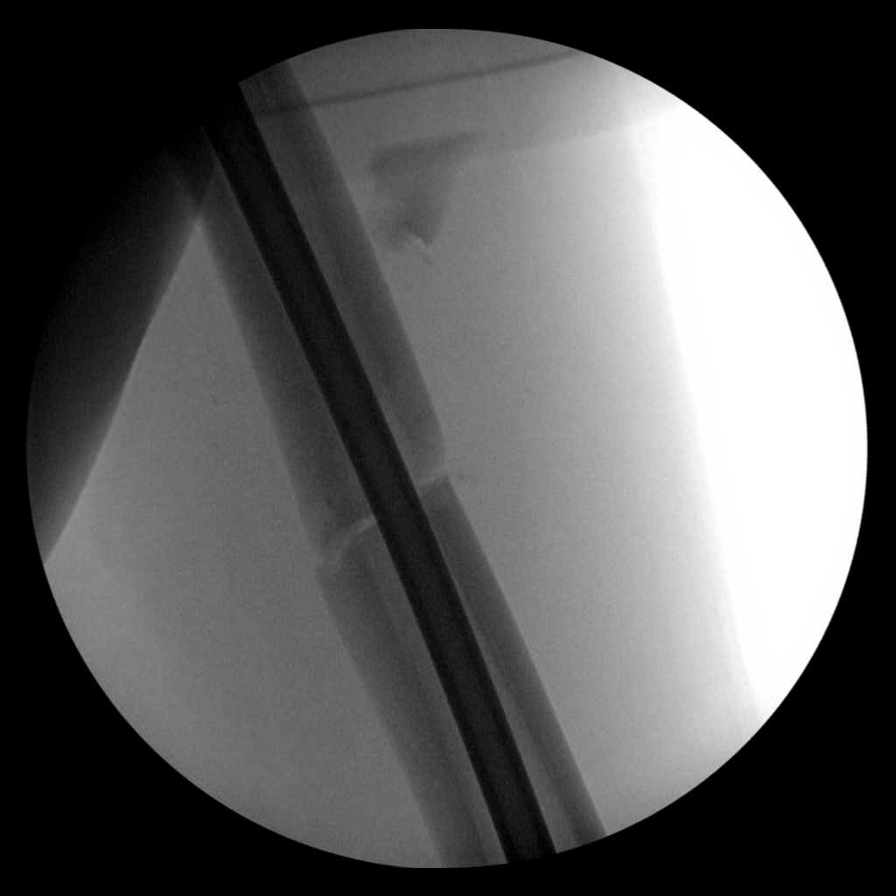
[im 6/7]
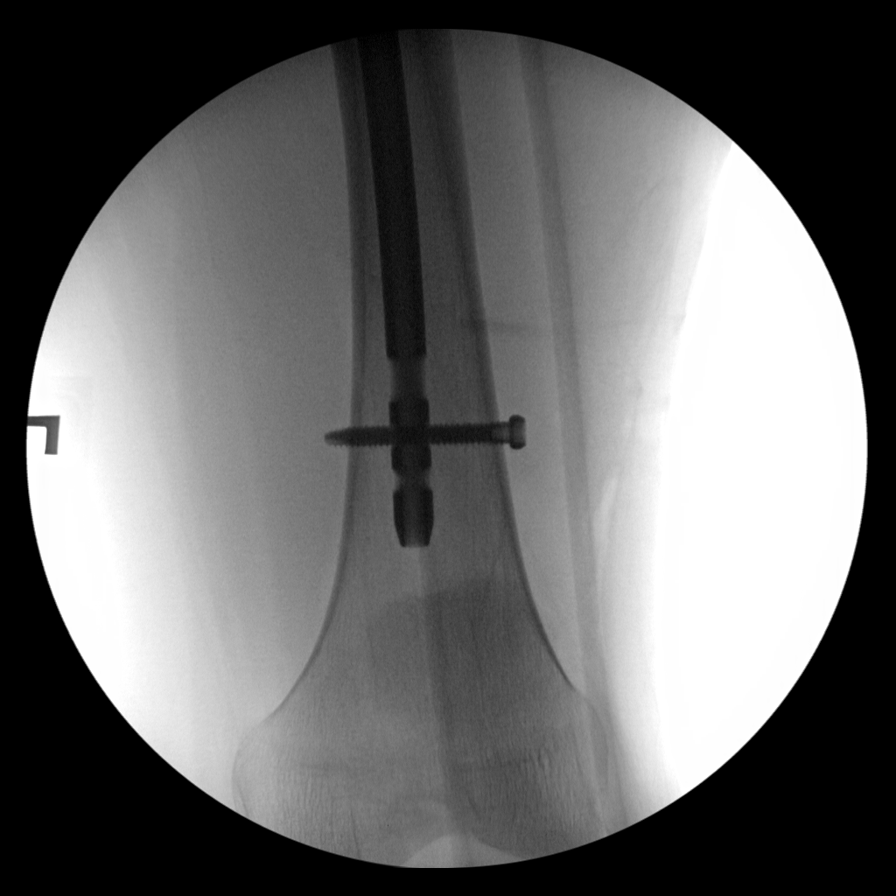
[im 7/7]
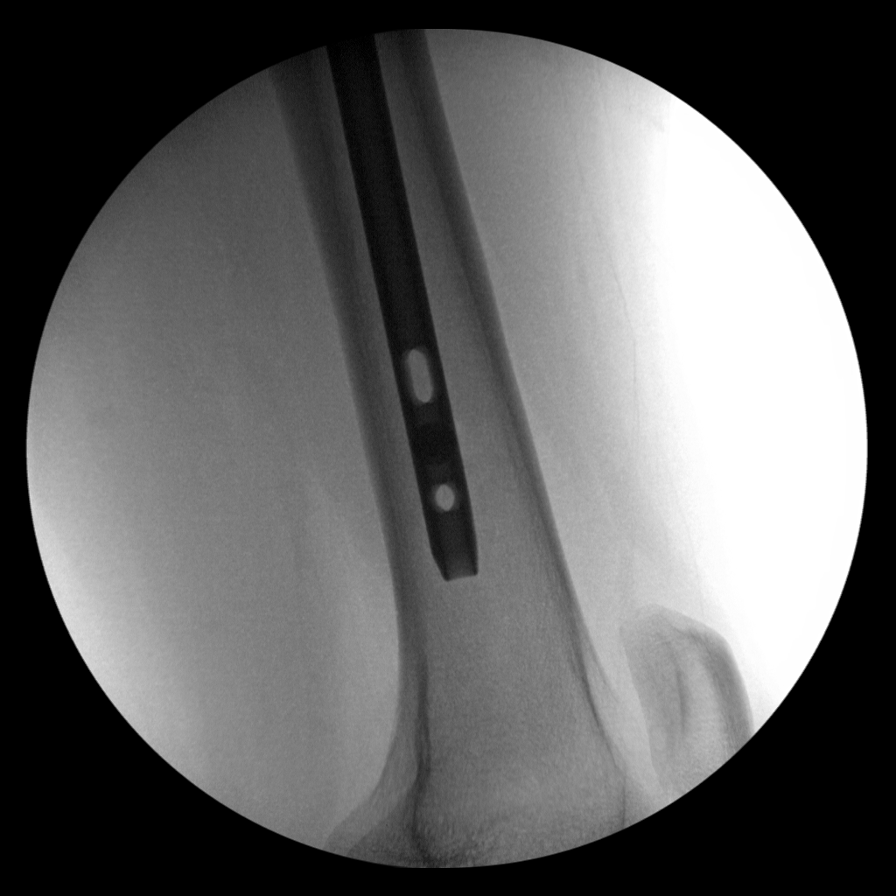

[7 of 7 positions shown; findings below may reference images not displayed]

FLUOROSCOPY TIME:  Fluoroscopy Time:  1 minutes 45 seconds

Radiation Exposure Index (if provided by the fluoroscopic device):
Not available

Number of Acquired Spot Images: 7
FINDINGS: Left femoral fracture is again identified. Medullary rod is noted
traversing fracture site with near anatomic alignment fracture
fragments. The proximal and distal fixation screws are seen.
IMPRESSION: ORIF of left femoral fracture.

## 2024-01-17 ENCOUNTER — Emergency Department (HOSPITAL_COMMUNITY): Payer: Self-pay

## 2024-01-17 ENCOUNTER — Other Ambulatory Visit: Payer: Self-pay

## 2024-01-17 ENCOUNTER — Encounter (HOSPITAL_COMMUNITY): Payer: Self-pay | Admitting: Emergency Medicine

## 2024-01-17 ENCOUNTER — Inpatient Hospital Stay (HOSPITAL_COMMUNITY)
Admission: EM | Admit: 2024-01-17 | Discharge: 2024-01-20 | DRG: 603 | Disposition: A | Discharge status: Home / Self Care | Payer: Self-pay | Attending: Internal Medicine | Admitting: Internal Medicine

## 2024-01-17 DIAGNOSIS — Z23 Encounter for immunization: Secondary | ICD-10-CM

## 2024-01-17 DIAGNOSIS — D649 Anemia, unspecified: Secondary | ICD-10-CM | POA: Diagnosis present

## 2024-01-17 DIAGNOSIS — Z86718 Personal history of other venous thrombosis and embolism: Secondary | ICD-10-CM

## 2024-01-17 DIAGNOSIS — R6510 Systemic inflammatory response syndrome (SIRS) of non-infectious origin without acute organ dysfunction: Principal | ICD-10-CM

## 2024-01-17 DIAGNOSIS — Z880 Allergy status to penicillin: Secondary | ICD-10-CM

## 2024-01-17 DIAGNOSIS — L089 Local infection of the skin and subcutaneous tissue, unspecified: Secondary | ICD-10-CM

## 2024-01-17 DIAGNOSIS — G894 Chronic pain syndrome: Secondary | ICD-10-CM | POA: Diagnosis present

## 2024-01-17 DIAGNOSIS — F17210 Nicotine dependence, cigarettes, uncomplicated: Secondary | ICD-10-CM | POA: Diagnosis present

## 2024-01-17 DIAGNOSIS — I959 Hypotension, unspecified: Secondary | ICD-10-CM | POA: Diagnosis present

## 2024-01-17 DIAGNOSIS — L03115 Cellulitis of right lower limb: Secondary | ICD-10-CM

## 2024-01-17 LAB — COMPREHENSIVE METABOLIC PANEL WITH GFR
ALT: 15 U/L (ref 0–44)
AST: 18 U/L (ref 15–41)
Albumin: 3.5 g/dL (ref 3.5–5.0)
Alkaline Phosphatase: 65 U/L (ref 38–126)
Anion gap: 10 (ref 5–15)
BUN: 9 mg/dL (ref 6–20)
CO2: 25 mmol/L (ref 22–32)
Calcium: 9.2 mg/dL (ref 8.9–10.3)
Chloride: 102 mmol/L (ref 98–111)
Creatinine, Ser: 0.78 mg/dL (ref 0.61–1.24)
GFR, Estimated: >60 mL/min (ref >60)
Glucose, Bld: 117 mg/dL — ABNORMAL HIGH (ref 70–99)
Potassium: 4.1 mmol/L (ref 3.5–5.1)
Sodium: 137 mmol/L (ref 135–145)
Total Bilirubin: 0.6 mg/dL (ref 0.0–1.2)
Total Protein: 7.3 g/dL (ref 6.5–8.1)

## 2024-01-17 LAB — CBC WITH DIFFERENTIAL/PLATELET
Abs Immature Granulocytes: 0.05 10*3/uL (ref 0.00–0.07)
Basophils Absolute: 0.1 10*3/uL (ref 0.0–0.1)
Basophils Relative: 0 %
Eosinophils Absolute: 0.1 10*3/uL (ref 0.0–0.5)
Eosinophils Relative: 1 %
HCT: 42.3 % (ref 39.0–52.0)
Hemoglobin: 14.2 g/dL (ref 13.0–17.0)
Immature Granulocytes: 0 %
Lymphocytes Relative: 12 %
Lymphs Abs: 1.7 10*3/uL (ref 0.7–4.0)
MCH: 29.5 pg (ref 26.0–34.0)
MCHC: 33.6 g/dL (ref 30.0–36.0)
MCV: 87.9 fL (ref 80.0–100.0)
Monocytes Absolute: 1.1 10*3/uL — ABNORMAL HIGH (ref 0.1–1.0)
Monocytes Relative: 7 %
Neutro Abs: 11.9 10*3/uL — ABNORMAL HIGH (ref 1.7–7.7)
Neutrophils Relative %: 80 %
Platelets: 325 10*3/uL (ref 150–400)
RBC: 4.81 MIL/uL (ref 4.22–5.81)
RDW: 12.6 % (ref 11.5–15.5)
WBC: 14.9 10*3/uL — ABNORMAL HIGH (ref 4.0–10.5)
nRBC: 0.3 % — ABNORMAL HIGH (ref 0.0–0.2)

## 2024-01-17 LAB — I-STAT CG4 LACTIC ACID, ED: Lactic Acid, Venous: 1 mmol/L (ref 0.5–1.9)

## 2024-01-17 MED ORDER — ENOXAPARIN SODIUM 40 MG/0.4ML IJ SOSY
40.0000 mg | PREFILLED_SYRINGE | INTRAMUSCULAR | Status: DC
  Administered 2024-01-17 – 2024-01-19 (×3): 40 mg via SUBCUTANEOUS
  Filled 2024-01-17 (×3): qty 0.4

## 2024-01-17 MED ORDER — VANCOMYCIN HCL 1500 MG/300ML IV SOLN
1500.0000 mg | Freq: Once | INTRAVENOUS | Status: AC
  Administered 2024-01-17: 1500 mg via INTRAVENOUS
  Filled 2024-01-17: qty 300

## 2024-01-17 MED ORDER — VANCOMYCIN HCL 1500 MG/300ML IV SOLN
1500.0000 mg | Freq: Two times a day (BID) | INTRAVENOUS | Status: DC
  Administered 2024-01-18 – 2024-01-20 (×5): 1500 mg via INTRAVENOUS
  Filled 2024-01-17 (×5): qty 300

## 2024-01-17 MED ORDER — SODIUM CHLORIDE 0.9% FLUSH
3.0000 mL | Freq: Two times a day (BID) | INTRAVENOUS | Status: DC
  Administered 2024-01-17 – 2024-01-19 (×3): 3 mL via INTRAVENOUS

## 2024-01-17 MED ORDER — ACETAMINOPHEN 650 MG RE SUPP: 650.0000 mg | Freq: Four times a day (QID) | RECTAL | Status: DC | PRN

## 2024-01-17 MED ORDER — ONDANSETRON HCL 4 MG PO TABS
4.0000 mg | ORAL_TABLET | Freq: Four times a day (QID) | ORAL | Status: DC | PRN
Start: 2024-01-17 — End: 2024-01-20

## 2024-01-17 MED ORDER — LACTATED RINGERS IV SOLN: INTRAVENOUS | Status: AC

## 2024-01-17 MED ORDER — PIPERACILLIN-TAZOBACTAM 3.375 G IVPB
3.3750 g | Freq: Three times a day (TID) | INTRAVENOUS | Status: DC
  Administered 2024-01-17: 3.375 g via INTRAVENOUS
  Filled 2024-01-17: qty 50

## 2024-01-17 MED ORDER — SODIUM CHLORIDE 0.9% FLUSH: 3.0000 mL | INTRAVENOUS | Status: DC | PRN

## 2024-01-17 MED ORDER — ACETAMINOPHEN 325 MG PO TABS: 650.0000 mg | ORAL_TABLET | Freq: Four times a day (QID) | ORAL | Status: DC | PRN

## 2024-01-17 MED ORDER — SODIUM CHLORIDE 0.9 % IV BOLUS
1000.0000 mL | Freq: Once | INTRAVENOUS | Status: AC
  Administered 2024-01-17: 1000 mL via INTRAVENOUS

## 2024-01-17 MED ORDER — LINEZOLID 600 MG PO TABS: 600.0000 mg | ORAL_TABLET | Freq: Once | ORAL | Status: DC

## 2024-01-17 MED ORDER — ACETAMINOPHEN 500 MG PO TABS
1000.0000 mg | ORAL_TABLET | Freq: Once | ORAL | Status: AC
  Administered 2024-01-17: 1000 mg via ORAL
  Filled 2024-01-17: qty 2

## 2024-01-17 MED ORDER — SODIUM CHLORIDE 0.9 % IV SOLN: 250.0000 mL | INTRAVENOUS | Status: AC | PRN

## 2024-01-17 MED ORDER — SODIUM CHLORIDE 0.9% FLUSH
3.0000 mL | Freq: Two times a day (BID) | INTRAVENOUS | Status: DC
  Administered 2024-01-17: 3 mL via INTRAVENOUS

## 2024-01-17 MED ORDER — ONDANSETRON HCL 4 MG/2ML IJ SOLN: 4.0000 mg | Freq: Four times a day (QID) | INTRAMUSCULAR | Status: DC | PRN

## 2024-01-17 MED ORDER — SODIUM CHLORIDE 0.9 % IV SOLN
1.0000 g | Freq: Once | INTRAVENOUS | Status: AC
  Administered 2024-01-17: 1 g via INTRAVENOUS
  Filled 2024-01-17: qty 10

## 2024-01-17 MED ORDER — TETANUS-DIPHTH-ACELL PERTUSSIS 5-2.5-18.5 LF-MCG/0.5 IM SUSY
0.5000 mL | PREFILLED_SYRINGE | Freq: Once | INTRAMUSCULAR | Status: AC
  Administered 2024-01-17: 0.5 mL via INTRAMUSCULAR
  Filled 2024-01-17: qty 0.5

## 2024-01-17 NOTE — ED Triage Notes (Signed)
 Pt reports wound on right leg x 5 days. Pt has drainage and erythema to the wound. Denies fevers.

## 2024-01-17 NOTE — Progress Notes (Addendum)
 Pharmacy Antibiotic Note  Bob Hawkins is a 28 y.o. male admitted on 01/17/2024 with a wound infection.  Pharmacy has been consulted for vancomycin and zosyn dosing in setting of cellulitis. Vancomycin 1500mg  IV and CRO 2g IV x1 given in ED.   Scr 0.78, WBC 14.9, Tmax 100.5   Plan: Vancomycin 1500mg  IV every 12 hours (ACU 460.2, Scr used 0.78, IBW, Vd 0.72) Zosyn 3.375g IV every 8 hours (4-hr infusion) Monitor clinical improvement, fever curve  Follow up cultures, ability to de-escalate  Levels as indicated     Temp (24hrs), Avg:99.7 F (37.6 C), Min:98.8 F (37.1 C), Max:100.5 F (38.1 C)  Recent Labs  Lab 01/17/24 1158 01/17/24 1828  WBC 14.9*  --   CREATININE 0.78  --   LATICACIDVEN  --  1.0    CrCl cannot be calculated (Unknown ideal weight.).    Allergies  Allergen Reactions   Penicillins Hives    Antimicrobials this admission: Vancomycin 6/19 >> Zosyn 6/19 >> CRO x 1 6/19   Microbiology results: 6/19 wound cx: GP cocci 6/19 Bcx:   Thank you for allowing pharmacy to be a part of this patient's care.  Jaylen Knope 01/17/2024 9:04 PM

## 2024-01-17 NOTE — Progress Notes (Signed)
 ED Pharmacy Antibiotic Sign Off An antibiotic consult was received from an ED provider for vancomycin per pharmacy dosing for wound infection. A chart review was completed to assess appropriateness.   The following one time order(s) were placed:  Vancomycin 1500mg  IV x 1   Further antibiotic and/or antibiotic pharmacy consults should be ordered by the admitting provider if indicated.   Thank you for allowing pharmacy to be a part of this patient's care.   Harvest Lineman, [facility]  Clinical Pharmacist 01/17/24 6:24 PM

## 2024-01-17 NOTE — ED Provider Notes (Signed)
 [place] EMERGENCY DEPARTMENT AT [facility] Provider Note   CSN: [id number] Arrival date & time: 01/17/24  1135    Patient presents with: Wound Infection   Bob Hawkins is Hawkins 28 y.o. male here for evaluation of wound to his anterior right lower extremity.  Patient states about Hawkins week ago he was underneath his crawlspace.  He is unsure if he scratched or punctured anything to his leg however the next day he had some aching to his mid shin.  He subsequently developed rest of redness, warmth and drainage.  No numbness or weakness however states he has significant pain when he tries to ambulate on the leg.  Has had some chills at home without documented fever.  No chest pain, shortness of breath, abdominal pain.  No history of PE or DVT.  He has not taken any medicine for his symptoms.   HPI     Prior to Admission medications   Not on File    Allergies: Penicillins    Review of Systems  Constitutional:  Positive for chills and fever.  HENT: Negative.    Respiratory: Negative.    Cardiovascular: Negative.   Gastrointestinal: Negative.   Genitourinary: Negative.   Musculoskeletal:        Tenderness anterior right shin  Skin:  Positive for color change and wound.  Neurological: Negative.   All other systems reviewed and are negative.   Updated Vital Signs BP 123/69   Pulse 86   Temp 99.8 F (37.7 C) (Oral)   Resp (!) 22   SpO2 98%   Physical Exam Vitals and nursing note reviewed.  Constitutional:      General: He is not in acute distress.    Appearance: He is well-developed. He is not ill-appearing, toxic-appearing or diaphoretic.  HENT:     Head: Normocephalic and atraumatic.     Nose: Nose normal.   Eyes:     Pupils: Pupils are equal, round, and reactive to light.    Cardiovascular:     Rate and Rhythm: Normal rate and regular rhythm.     Pulses: Normal pulses.          Radial pulses are 2+ on the right side and 2+ on the left side.        Dorsalis pedis pulses are 2+ on the right side and 2+ on the left side.     Heart sounds: Normal heart sounds.  Pulmonary:     Effort: Pulmonary effort is normal. No respiratory distress.     Breath sounds: Normal breath sounds.  Abdominal:     General: There is no distension.     Palpations: Abdomen is soft.     Tenderness: There is no abdominal tenderness. There is no right CVA tenderness, left CVA tenderness or guarding.   Musculoskeletal:        General: Swelling, tenderness and signs of injury present. Normal range of motion.     Cervical back: Normal range of motion and neck supple.     Right lower leg: Edema present.     Comments: Tenderness diffusely right lower extremity from knee distally.  He has edema which starts just below the tib-fib through his midfoot.  Surrounding erythema, warmth.  He has some fluctuance with purulent drainage to his right midshaft tib-fib.  No induration.  Full range of motion to extremities.   Skin:    General: Skin is warm and dry.   Neurological:     General:  No focal deficit present.     Mental Status: He is alert and oriented to person, place, and time.        (all labs ordered are listed, but only abnormal results are displayed) Labs Reviewed  CBC WITH DIFFERENTIAL/PLATELET - Abnormal; Notable for the following components:      Result Value   WBC 14.9 (*)    nRBC 0.3 (*)    Neutro Abs 11.9 (*)    Monocytes Absolute 1.1 (*)    All other components within normal limits  COMPREHENSIVE METABOLIC PANEL WITH GFR - Abnormal; Notable for the following components:   Glucose, Bld 117 (*)    All other components within normal limits  AEROBIC CULTURE W GRAM STAIN (SUPERFICIAL SPECIMEN)  CULTURE, BLOOD (ROUTINE X 2)  CBC  COMPREHENSIVE METABOLIC PANEL WITH GFR  HIV ANTIBODY (ROUTINE TESTING W REFLEX)  I-STAT CG4 LACTIC ACID, ED    EKG: None  Radiology: DG Tibia/Fibula Right Result Date: 01/17/2024 CLINICAL DATA:  Wound on right  leg x5 days. EXAM: RIGHT TIBIA AND FIBULA - 2 VIEW COMPARISON:  None Available. FINDINGS: There is no evidence of fracture or other focal bone lesions. Mild focal soft tissue swelling is seen along the anterior aspect of the proximal right tibial shaft. No soft tissue air is identified. IMPRESSION: Mild focal soft tissue swelling along the anterior aspect of the proximal right tibial shaft. Electronically Signed   By: Virgle Grime M.D.   On: 01/17/2024 19:37     .Ultrasound ED Soft Tissue  Date/Time: 01/17/2024 8:07 PM  Performed by: Dickson Founds, PA-C Authorized by: Dickson Founds, PA-C   Procedure details:    Indications: limb pain, localization of abscess and evaluate for cellulitis     Transverse view:  Visualized   Longitudinal view:  Visualized   Images: archived   Location:    Location: lower extremity     Side:  Right Findings:     abscess present    cellulitis present    no foreign body present .Incision and Drainage  Date/Time: 01/17/2024 8:33 PM  Performed by: Dickson Founds, PA-C Authorized by: Dickson Founds, PA-C   Consent:    Consent obtained:  Verbal   Consent given by:  Patient   Risks discussed:  Bleeding, incomplete drainage, pain and damage to other organs   Alternatives discussed:  No treatment, delayed treatment, alternative treatment, observation and referral Universal protocol:    Procedure explained and questions answered to patient or proxy's satisfaction: yes     Relevant documents present and verified: yes     Test results available : yes     Imaging studies available: yes     Required blood products, implants, devices, and special equipment available: yes     Site/side marked: yes     Immediately prior to procedure, Hawkins time out was called: yes     Patient identity confirmed:  Verbally with patient Location:    Type:  Abscess Pre-procedure details:    Skin preparation:  Betadine Anesthesia:    Anesthesia method:  Local  infiltration Procedure type:    Complexity:  Complex Procedure details:    Incision types:  Single straight   Incision depth:  Subcutaneous   Wound management:  Probed and deloculated, irrigated with saline and extensive cleaning   Drainage:  Purulent   Drainage amount:  Scant   Wound treatment:  Wound left open   Packing materials:  None Post-procedure details:  Procedure completion:  Tolerated well, no immediate complications .Critical Care  Performed by: Dickson Founds, PA-C Authorized by: Dickson Founds, PA-C   Critical care provider statement:    Critical care time (minutes):  35   Critical care was necessary to treat or prevent imminent or life-threatening deterioration of the following conditions:  Sepsis   Critical care was time spent personally by me on the following activities:  Development of treatment plan with patient or surrogate, discussions with consultants, evaluation of patient's response to treatment, examination of patient, ordering and review of laboratory studies, ordering and review of radiographic studies, ordering and performing treatments and interventions, pulse oximetry, re-evaluation of patient's condition and review of old charts    Medications Ordered in the ED  cefTRIAXone (ROCEPHIN) 1 g in sodium chloride  0.9 % 100 mL IVPB (has no administration in time range)  enoxaparin  (LOVENOX ) injection 40 mg (has no administration in time range)  sodium chloride  flush (NS) 0.9 % injection 3 mL (has no administration in time range)  sodium chloride  flush (NS) 0.9 % injection 3 mL (has no administration in time range)  sodium chloride  flush (NS) 0.9 % injection 3 mL (has no administration in time range)  0.9 %  sodium chloride  infusion (has no administration in time range)  acetaminophen  (TYLENOL ) tablet 650 mg (has no administration in time range)    Or  acetaminophen  (TYLENOL ) suppository 650 mg (has no administration in time range)  ondansetron   (ZOFRAN ) tablet 4 mg (has no administration in time range)    Or  ondansetron  (ZOFRAN ) injection 4 mg (has no administration in time range)  lactated ringers  infusion (has no administration in time range)  Tdap (BOOSTRIX ) injection 0.5 mL (has no administration in time range)  sodium chloride  0.9 % bolus 1,000 mL (0 mLs Intravenous Stopped 01/17/24 1858)  acetaminophen  (TYLENOL ) tablet 1,000 mg (1,000 mg Oral Given 01/17/24 1828)  vancomycin (VANCOREADY) IVPB 1500 mg/300 mL (1,500 mg Intravenous New Bag/Given 01/17/24 8735)   28 year old here for evaluation of wound to his right lower extremity.  Started about Hawkins week ago after being underneath the crawlspace.  He is unsure if he injured his leg.  Has had progressive redness, warmth, swelling over the last few days noticing some purulent discharge to wound.  Today had some chills.  On arrival noted to be febrile, tachycardic.  Workup from triage personally viewed and interpreted:  CBC leukocytosis 14.9 Metabolic panel without significant abnormality Lactic 1.0 Blood cultures obtained X-ray without any gas-forming organism does show some soft tissue edema  Bedside ultrasound shows diffuse cellulitis, poss small abscess.  Attempted bedside I&D without any significant drainage however has had some mild intermittent purulent drainage here.  Given his fever, leukocytosis, rapid progression per patient we will plan admission for IV antibiotics.  Started on Vanco and Rocephin.  Consult with Dr. Sundil with medicine who is agreeable to evaluate patient for admission. Tdap Uptodate  Low suspicion for necrotizing infection, open fracture, osteomyelitis.  Given his fever, tachycardia and leukocytosis does meet sepsis criteria  The patient appears reasonably stabilized for admission considering the current resources, flow, and capabilities available in the ED at this time, and I doubt any other [facility] requiring further screening and/or treatment in the ED  prior to admission.     Clinical Course as of 01/17/24 2059  Thu Jan 17, 2024  2048 Dr. Sundil with medicine [BH]    Clinical Course User Index [BH] Bob Rede A, PA-C  Medical Decision Making Amount and/or Complexity of Data Reviewed External Data Reviewed: labs, radiology and notes. Labs: ordered. Decision-making details documented in ED Course. Radiology: ordered and independent interpretation performed. Decision-making details documented in ED Course.  Risk OTC drugs. Prescription drug management. Parenteral controlled substances. Decision regarding hospitalization. Diagnosis or treatment significantly limited by social determinants of health.      Final diagnoses:  Wound infection  SIRS (systemic inflammatory response syndrome) [facility])    ED Discharge Orders     None          Dang Mathison A, PA-C 01/17/24 2059    Lowery Rue, DO 01/17/24 2247

## 2024-01-17 NOTE — ED Provider Triage Note (Signed)
 Emergency Medicine Provider Triage Evaluation Note  Bob Hawkins , a 28 y.o. male  was evaluated in triage.  Pt complains of purulent draining wound to the shin of the right leg with surrounding erythema and tenderness.  This wound has been present for the last 5 days, has progressively worsened over that time.  No systemic symptoms presently..  Review of Systems  Positive: Wound and erythema to the right leg Negative: Fever, chills, nausea, vomiting  Physical Exam  BP 124/69 (BP Location: Left Arm)   Pulse 83   Temp (!) 100.5 F (38.1 C)   Resp 16   SpO2 100%  Gen:   Awake, no distress   Resp:  Normal effort  MSK:   Moves extremities without difficulty  Other:  Noted punctate wound to the proximal shin of the right leg, surrounding erythema was marked with a skin marker at time of evaluation.  Tenderness to the affected area noted.  Medical Decision Making  Medically screening exam initiated at 5:54 PM.  Appropriate orders placed.  Bob Hawkins was informed that the remainder of the evaluation will be completed by another provider, this initial triage assessment does not replace that evaluation, and the importance of remaining in the ED until their evaluation is complete.  Based on evaluation this patient, the believe this is consistent with cellulitis secondary to punctate wound on the anterior shin.  Based on this we will begin further workup for cellulitis, and provide initial doses of antibiotics.   Juanetta Nordmann, [place] 01/17/24 [phone]

## 2024-01-17 NOTE — H&P (Addendum)
 History and Physical    Bob Hawkins [address] Bob Hawkins UEA:[id number] DOB: 1996/03/08 DOA: 01/17/2024  PCP: Pcp, No   Patient coming from: Home   Chief Complaint:  Chief Complaint  Patient presents with   Wound Infection   ED TRIAGE note:  Pt reports wound on right leg x 5 days. Pt has drainage and erythema to the wound. Denies fevers      HPI:  Bob Hawkins is a 28 y.o. male with medical history significant of critical polytrauma in 2022 (review discharge summary from 01/01/2021), DVT of left lower extremity treated with Lovenox  transition to Xarelto , chronic pain syndrome and chronic anemia presented to emergency department complaining of wound of the right lower extremity. Patient reported that 1 week ago was underneath his crawlspace. He is unsure if he scratched or punctured anything to his leg however the next day he had some aching to his mid shin. He subsequently developed rest of redness, warmth and drainage. No numbness or weakness however states he has significant pain when he tries to ambulate on the leg.    ED Course:  Presentation to ED patient is febrile otherwise hemodynamically stable. Lactic acid within normal range.  CMP unremarkable.  CBC showing leukocytosis 14.9 otherwise normal findings. Pending blood culture and wound culture aerobic.  X-ray of the right sided tibia/fibula showed Mild focal soft tissue swelling along the anterior aspect of the proximal right tibial shaft.  In the ED patient has been started on ceftriaxone vancomycin and received 1 L of LR bolus.  ED physician reported that physical exam showing possible early development of abscess however ultrasound unable to showed anything significant.  At the bedside tried to do an small I&D with some discharge which has been sent for culture showing gram-positive cocci.  Hospitalist has been consulted for further management of right lower extremity cellulitis and possible  abscess.   Significant labs in the ED: Lab Orders         Culture, blood (routine x 2)         Aerobic Culture w Gram Stain (superficial specimen)         CBC with Differential         Comprehensive metabolic panel         CBC         Comprehensive metabolic panel         HIV Antibody (routine testing w rflx)         I-Stat Lactic Acid, ED       Review of Systems:  Review of Systems  Constitutional:  Negative for chills, fever, malaise/fatigue and weight loss.  Respiratory:  Negative for cough and sputum production.   Cardiovascular:  Negative for chest pain and palpitations.  Gastrointestinal:  Negative for abdominal pain, nausea and vomiting.  Musculoskeletal:  Negative for myalgias.       Right lower extremity pain around the wound area  Neurological:  Negative for dizziness and headaches.  Psychiatric/Behavioral:  The patient is not nervous/anxious.     History reviewed. No pertinent past medical history.  Past Surgical History:  Procedure Laterality Date   FEMUR IM NAIL Left 12/17/2020   Procedure: INTRAMEDULLARY (IM) NAIL FEMORAL;  Surgeon: Arnie Lao, MD;  Location: [facility] OR;  Service: Orthopedics;  Laterality: Left;   NO PAST SURGERIES       reports that he has been smoking cigarettes. He has never used smokeless tobacco. He reports current alcohol use. He reports that  he does not use drugs.  Allergies  Allergen Reactions   Penicillins Hives    History reviewed. No pertinent family history.  Prior to Admission medications   Not on File     Physical Exam: Vitals:   01/17/24 1945 01/17/24 2100 01/17/24 2159 01/17/24 2300  BP: 123/69   115/70  Pulse: 86   78  Resp: (!) 22     Temp:   98.9 F (37.2 C)   TempSrc:   Temporal   SpO2: 98%   100%  Weight:  81.6 kg    Height:  5' 6 (1.676 m)      Physical Exam Vitals and nursing note reviewed.  Constitutional:      Appearance: He is not ill-appearing.  HENT:     Mouth/Throat:     Mouth:  Mucous membranes are moist.   Eyes:     Pupils: Pupils are equal, round, and reactive to light.    Cardiovascular:     Rate and Rhythm: Normal rate and regular rhythm.     Pulses: Normal pulses.     Heart sounds: Normal heart sounds.  Pulmonary:     Effort: Pulmonary effort is normal.     Breath sounds: Normal breath sounds.   Musculoskeletal:        General: Tenderness and signs of injury present. No swelling.     Cervical back: Neck supple.     Comments: Right-sided lower extremity erythema and tenderness on palpation anterior aspects.   Skin:    Capillary Refill: Capillary refill takes less than 2 seconds.     Findings: Erythema present.   Neurological:     Mental Status: He is alert and oriented to person, place, and time.   Psychiatric:        Mood and Affect: Mood normal.        Thought Content: Thought content normal.      Labs on Admission: I have personally reviewed following labs and imaging studies  CBC:  Media Information   Document Information  Photos    01/17/2024 18:15  Attached To:  Hospital Encounter on 01/17/24  Source Information  Pur[facility] Bruckner  [facility]    Media Information   Document Information  Photos    01/17/2024 18:17  Attached To:  Hospital Encounter on 01/17/24  Source Information  Dickson Founds, PA-C  [facility]   Recent Labs  Lab 01/17/24 1158  WBC 14.9*  NEUTROABS 11.9*  HGB 14.2  HCT 42.3  MCV 87.9  PLT 325   Basic Metabolic Panel: Recent Labs  Lab 01/17/24 1158  NA 137  K 4.1  CL 102  CO2 25  GLUCOSE 117*  BUN 9  CREATININE 0.78  CALCIUM 9.2   GFR: Estimated Creatinine Clearance: 139.1 mL/min (by C-G formula based on SCr of 0.78 mg/dL). Liver Function Tests: Recent Labs  Lab 01/17/24 1158  AST 18  ALT 15  ALKPHOS 65  BILITOT 0.6  PROT 7.3  ALBUMIN  3.5   No results for input(s): LIPASE, AMYLASE in the last 168 hours. No results for input(s): AMMONIA  in the last 168 hours. Coagulation Profile: No results for input(s): INR, PROTIME in the last 168 hours. Cardiac Enzymes: No results for input(s): CKTOTAL, CKMB, CKMBINDEX, TROPONINI, TROPONINIHS in the last 168 hours. BNP (last 3 results) No results for input(s): BNP in the last 8760 hours. HbA1C: No results for input(s): HGBA1C in the last 72 hours. CBG: No results for input(s): GLUCAP in  the last 168 hours. Lipid Profile: No results for input(s): CHOL, HDL, LDLCALC, TRIG, CHOLHDL, LDLDIRECT in the last 72 hours. Thyroid Function Tests: No results for input(s): TSH, T4TOTAL, FREET4, T3FREE, THYROIDAB in the last 72 hours. Anemia Panel: No results for input(s): VITAMINB12, FOLATE, FERRITIN, TIBC, IRON, RETICCTPCT in the last 72 hours. Urine analysis: No results found for: COLORURINE, APPEARANCEUR, LABSPEC, PHURINE, GLUCOSEU, HGBUR, BILIRUBINUR, KETONESUR, PROTEINUR, UROBILINOGEN, NITRITE, LEUKOCYTESUR  Radiological Exams on Admission: I have personally reviewed images DG Tibia/Fibula Right Result Date: 01/17/2024 CLINICAL DATA:  Wound on right leg x5 days. EXAM: RIGHT TIBIA AND FIBULA - 2 VIEW COMPARISON:  None Available. FINDINGS: There is no evidence of fracture or other focal bone lesions. Mild focal soft tissue swelling is seen along the anterior aspect of the proximal right tibial shaft. No soft tissue air is identified. IMPRESSION: Mild focal soft tissue swelling along the anterior aspect of the proximal right tibial shaft. Electronically Signed   By: Virgle Grime M.D.   On: 01/17/2024 19:37       Assessment/Plan: Principal Problem:   Cellulitis of right lower extremity Active Problems:   History of DVT (deep vein thrombosis)    Assessment and Plan: Right lower extremity cellulitis -Patient presented emergency department worsening swelling, redness and pain of left right lower extremity  around the shin area.  Patient reported he crawled in the crawl space few days ago maybe have injured with something unable to recall. -Physical exam showed swelling, skin tightness and minimal induration of the right lower extremity concern for early development of abscess. -At presentation to ED patient found to have low-grade temperature and borderline hypotensive.  CBC showing leukocytosis 14.9. -In the ED right lower extremity I&D tried to be performed however minimal discharge collected for anaerobic culture.  Blood cultures are in process.  Normal lactic acid level.   -X-ray of right tibia-fibula Mild focal soft tissue swelling along the anterior aspect of the proximal right tibial shaft. -Unsuccessful attempt of the ultrasound looking for abscess formation. -In the ED patient has been given 1 L of NS bolus, vancomycin and ceftriaxone. -Obtaining MRI right lower extremity tibia-fibula to rule out underlying abscess. - Continue broad-spectrum antibiotic coverage with IV vancomycin and Zosyn. - Pending blood culture result. - Continue maintenance fluid LR 125 cc/h. -Giving Tdap booster vaccine. -Consulting Dr. Julio Ohm will see patient tomorrow for further evaluation.   History of DVT 2022 History of polytrauma in 2022 History of DVT and critical polytrauma 2022.  Completed 6 months of DVT prophylaxis.    DVT prophylaxis:  Lovenox  Code Status:  Full Code Diet:  Family Communication:   Family was present at bedside, at the time of interview. Opportunity was given to ask question and all questions were answered satisfactorily.  Disposition Plan: Pending blood culture results and MRI of the right lower extremity. Consults: None indicated at this time Admission status:   Inpatient, Med-Surg  Severity of Illness: The appropriate patient status for this patient is INPATIENT. Inpatient status is judged to be reasonable and necessary in order to provide the required intensity of service to  ensure the patient's safety. The patient's presenting symptoms, physical exam findings, and initial radiographic and laboratory data in the context of their chronic comorbidities is felt to place them at high risk for further clinical deterioration. Furthermore, it is not anticipated that the patient will be medically stable for discharge from the hospital within 2 midnights of admission.   * I certify that at the point  of admission it is my clinical judgment that the patient will require inpatient hospital care spanning beyond 2 midnights from the point of admission due to high intensity of service, high risk for further deterioration and high frequency of surveillance required.Aaron Aas    Leonilda Cozby, MD [facility]  How to contact the [facility] Attending or Consulting provider 7A - 7P or covering provider during after hours 7P -7A, for this patient.  Check the care team in [facility] and look for a) attending/consulting [facility] provider listed and b) the [facility] team listed Log into [email or web address] and use [place]'s universal password to access. If you do not have the password, please contact the hospital operator. Locate the [facility] provider you are looking for under [facility] and page to a number that you can be directly reached. If you still have difficulty reaching the provider, please page the [facility] (Director on Call) for the Hospitalists listed on amion for assistance.  01/17/2024, 11:31 PM

## 2024-01-18 ENCOUNTER — Inpatient Hospital Stay (HOSPITAL_COMMUNITY): Payer: Self-pay

## 2024-01-18 LAB — COMPREHENSIVE METABOLIC PANEL WITH GFR
ALT: 15 U/L (ref 0–44)
AST: 14 U/L — ABNORMAL LOW (ref 15–41)
Albumin: 2.6 g/dL — ABNORMAL LOW (ref 3.5–5.0)
Alkaline Phosphatase: 52 U/L (ref 38–126)
Anion gap: 7 (ref 5–15)
BUN: 9 mg/dL (ref 6–20)
CO2: 23 mmol/L (ref 22–32)
Calcium: 8.4 mg/dL — ABNORMAL LOW (ref 8.9–10.3)
Chloride: 109 mmol/L (ref 98–111)
Creatinine, Ser: 0.66 mg/dL (ref 0.61–1.24)
GFR, Estimated: >60 mL/min (ref >60)
Glucose, Bld: 114 mg/dL — ABNORMAL HIGH (ref 70–99)
Potassium: 4 mmol/L (ref 3.5–5.1)
Sodium: 139 mmol/L (ref 135–145)
Total Bilirubin: 0.2 mg/dL (ref 0.0–1.2)
Total Protein: 5.7 g/dL — ABNORMAL LOW (ref 6.5–8.1)

## 2024-01-18 LAB — CBC
HCT: 36.4 % — ABNORMAL LOW (ref 39.0–52.0)
Hemoglobin: 12.3 g/dL — ABNORMAL LOW (ref 13.0–17.0)
MCH: 30 pg (ref 26.0–34.0)
MCHC: 33.8 g/dL (ref 30.0–36.0)
MCV: 88.8 fL (ref 80.0–100.0)
Platelets: 297 10*3/uL (ref 150–400)
RBC: 4.1 MIL/uL — ABNORMAL LOW (ref 4.22–5.81)
RDW: 12.6 % (ref 11.5–15.5)
WBC: 10.1 10*3/uL (ref 4.0–10.5)
nRBC: 0 % (ref 0.0–0.2)

## 2024-01-18 LAB — HIV ANTIBODY (ROUTINE TESTING W REFLEX): HIV Screen 4th Generation wRfx: NONREACTIVE

## 2024-01-18 MED ORDER — SODIUM CHLORIDE 0.9 % IV SOLN
1.0000 g | INTRAVENOUS | Status: DC
  Administered 2024-01-18 – 2024-01-19 (×2): 1 g via INTRAVENOUS
  Filled 2024-01-18 (×2): qty 10

## 2024-01-18 NOTE — ED Notes (Signed)
 Patient transported to MRI
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
## 2024-01-18 NOTE — ED Notes (Signed)
 Attempted to call charge RN for the floor but no answer so notified Diplomatic Services operational officer
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
## 2024-01-19 NOTE — Plan of Care (Signed)

## 2024-01-19 NOTE — Progress Notes (Signed)
 Patient ID: Bob Hawkins Jesus [place] Souter, male   DOB: 03/22/1996, 28 y.o.   MRN: [id number] Patient is seen in follow-up for infection right lower extremity.  The superficial area of fluid has decompressed and draining.  Recommend patient proceed with daily cleansing with soap and water and Vashe dressing with 4 x 4 gauze and an Ace wrap.  I will place an order for Vashe dressing changes today and patient can continue the Vashe dressing changes at home.  Recommend discharge on doxycycline 100 mg twice a day for 2 weeks.  I will follow-up in the office in 1 week.

## 2024-01-20 LAB — AEROBIC CULTURE W GRAM STAIN (SUPERFICIAL SPECIMEN)

## 2024-01-20 MED ORDER — DOXYCYCLINE HYCLATE 100 MG PO TABS: 100.0000 mg | ORAL_TABLET | Freq: Two times a day (BID) | ORAL | 0 refills | Status: AC

## 2024-01-20 MED ORDER — DOXYCYCLINE HYCLATE 100 MG PO TABS
100.0000 mg | ORAL_TABLET | Freq: Two times a day (BID) | ORAL | Status: DC
  Administered 2024-01-20: 100 mg via ORAL
  Filled 2024-01-20: qty 1
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
## 2024-01-22 LAB — CULTURE, BLOOD (ROUTINE X 2)
Culture: NO GROWTH
Special Requests: ADEQUATE
